# Patient Record
Sex: Female | Born: 1980 | Race: Black or African American | Hispanic: No | Marital: Single | Smoking: Former smoker
Health system: Southern US, Community
[De-identification: ages and names within clinical notes are randomized; demographics above are authoritative.]

## PROBLEM LIST (undated history)

## (undated) ENCOUNTER — Inpatient Hospital Stay (HOSPITAL_COMMUNITY): Payer: Self-pay

## (undated) DIAGNOSIS — E049 Nontoxic goiter, unspecified: Secondary | ICD-10-CM

## (undated) HISTORY — DX: Nontoxic goiter, unspecified: E04.9

---

## 2000-04-26 HISTORY — PX: EYE SURGERY: SHX253

## 2001-04-26 HISTORY — PX: PELVIC LAPAROSCOPY: SHX162

## 2001-07-26 ENCOUNTER — Encounter: Payer: Self-pay | Admitting: Obstetrics & Gynecology

## 2001-07-26 ENCOUNTER — Ambulatory Visit (HOSPITAL_COMMUNITY): Admission: RE | Admit: 2001-07-26 | Discharge: 2001-07-26 | Payer: Self-pay | Admitting: Obstetrics & Gynecology

## 2002-06-01 ENCOUNTER — Ambulatory Visit (HOSPITAL_COMMUNITY): Admission: RE | Admit: 2002-06-01 | Discharge: 2002-06-01 | Payer: Self-pay | Admitting: Obstetrics & Gynecology

## 2009-02-21 ENCOUNTER — Emergency Department (HOSPITAL_COMMUNITY): Admission: EM | Admit: 2009-02-21 | Discharge: 2009-02-21 | Payer: Self-pay | Admitting: Family Medicine

## 2009-02-24 ENCOUNTER — Ambulatory Visit (HOSPITAL_COMMUNITY): Admission: RE | Admit: 2009-02-24 | Discharge: 2009-02-24 | Payer: Self-pay | Admitting: Emergency Medicine

## 2009-03-11 ENCOUNTER — Emergency Department (HOSPITAL_COMMUNITY): Admission: EM | Admit: 2009-03-11 | Discharge: 2009-03-11 | Payer: Self-pay | Admitting: Family Medicine

## 2009-03-18 ENCOUNTER — Other Ambulatory Visit: Admission: RE | Admit: 2009-03-18 | Discharge: 2009-03-18 | Payer: Self-pay | Admitting: Interventional Radiology

## 2009-03-18 ENCOUNTER — Encounter: Admission: RE | Admit: 2009-03-18 | Discharge: 2009-03-18 | Payer: Self-pay | Admitting: Emergency Medicine

## 2010-05-17 ENCOUNTER — Encounter: Payer: Self-pay | Admitting: Emergency Medicine

## 2010-07-29 LAB — TSH: TSH: 0.021 u[IU]/mL — ABNORMAL LOW (ref 0.350–4.500)

## 2010-07-29 LAB — T4, FREE: Free T4: 1.16 ng/dL (ref 0.80–1.80)

## 2010-07-30 LAB — POCT I-STAT, CHEM 8
Chloride: 106 mEq/L (ref 96–112)
HCT: 42 % (ref 36.0–46.0)
Potassium: 4 mEq/L (ref 3.5–5.1)

## 2010-07-30 LAB — SEDIMENTATION RATE: Sed Rate: 9 mm/hr (ref 0–22)

## 2010-07-30 LAB — TSH: TSH: 0.028 u[IU]/mL — ABNORMAL LOW (ref 0.350–4.500)

## 2010-09-11 NOTE — Op Note (Signed)
NAME:  Tonya Porter, Tonya Porter NO.:  [id number]   MEDICAL RECORD NO.:  [id number]                   PATIENT TYPE:  AMB   LOCATION:  SDC                                  FACILITY:  [facility]   PHYSICIAN:  Genia Del, M.D.             DATE OF BIRTH:  02-25-1981   DATE OF PROCEDURE:  06/01/2002  DATE OF DISCHARGE:                                 OPERATIVE REPORT   PREOPERATIVE DIAGNOSIS:  Primary infertility and history of right  salpingectomy for ectopic pregnancy.   POSTOPERATIVE DIAGNOSES:  1. Primary infertility and history of right salpingectomy for ectopic     pregnancy.  2. Right tube surgically absent.  3. Left hydrosalpinx with obstruction.  4. Pelvic adhesions.   PROCEDURES:  1. Diagnostic laparoscopy with methylene blue hydrotubation.  2. Left distal tuboplasty.  3. Lysis of adhesions.   SURGEON:  Genia Del, M.D.   ANESTHESIOLOGIST:  Burnett Corrente, M.D.   DESCRIPTION OF PROCEDURE:  Under general anesthesia with endotracheal  intubation, the patient is in lithotomy position for operative laparoscopy.  She is prepped on the abdominal, suprapubic, vulvar, and vaginal areas, the  bladder is catheterized, and the patient is draped as usual.  The vaginal  exam under general anesthesia revealed a retroverted uterus, normal volume,  mobile, no adnexal mass.  We insert the speculum in the vagina.  The  anterior lip of the cervix is grasped with a one-toothed tenaculum, and the  uterus is cannulated with the Hulka cannula.  We then go abdominally.  An  infraumbilical incision is made with a scalpel over 10 mm after injecting  Marcaine 0.25% plain.  We insert the Veress needle while raising the  abdominal wall.  Security tests are done.  We then create a pneumoperitoneum  with 3 L of CO2.  Once achieved, the Veress needle is removed.  We insert  the trocar at that level with the laparoscope.  A brief inspection of the  pelvis cavity reveals  no adhesion with the anterior wall.  We therefore make  a 5 mm incision of the suprapubic level after injecting Marcaine 0.25%, and  we insert a 5 mm trocar at that level with a probe.  Inspection of the  pelvic cavity reveals a normal uterus in volume in appearance.  An adhesion  is present with the bladder on the right anterior uterus.  The right adnexa  shows no right tube.  It was removed because of an ectopic pregnancy  previously.  The right ovary is normal in volume and appearance.  A small  less than 2 cm solitary cyst is present.  On the left side the tube presents  a hydrosalpinx with obstruction, no fimbriae are present.  The tube is  adherent to the ovary, which is itself adherent to the wall, and the tube is  also adherent to the pelvic wall.  The left ovary is  normal in volume and  appearance otherwise.  Some fine adhesions are present in the posterior cul-  de-sac.  The appendix is normal and the rest of the abdominal cavity is  unremarkable.  Pictures are taken of the appendix and of the pelvic organs.  We then make a third incision on the left iliac area.  We transilluminate to  avoid the epigastric vessel.  We make a 5 mm incision with a scalpel after  injecting Marcaine 0.25%, and we enter a 5 mm trocar.  We proceed with  adhesiolysis in the posterior cul-de-sac.  We then go to the left tube.  Adhesions are removed with the rotatory scissors as much as possible.  It  would have been unsafe to attempt dissection of the very distal part of the  tube, since that area is in very close contact with the pelvic wall in  proximity with the vessels and the ureter.  We therefore proceed with a  distal tuboplasty, opening the tube as distally as possible using the  rotatory scissors combined with the bipolar for hemostasis.  After multiple  attempts verifying with methylene blue each time, we succeed in opening the  left tube.  Methylene blue is passing freely.  With tuboplasty  completed by  creating a wider opening with the rotatory scissors, the mucosa of the tube  is well-seen.  The tubal distal edge is free and is in close contact to the  left ovary, although the total length of the tube is about 5-6 cm.  Hemostasis is adequate.  The pelvic cavity is irrigated and suctioned.  Pictures are taken after tuboplasty is completed.  The instruments are  removed, CO2 is evacuated.  The incisions at the skin are closed with 4-0  Vicryl.  We also use them on top.  Hemostasis is adequate at all levels.  The estimated blood loss was 150 mL.  No complications occurred, and the  patient was brought to recovery room in good status.                                                Genia Del, M.D.    ML/MEDQ  D:  06/01/2002  T:  06/02/2002  Job:  [id number]

## 2010-09-25 ENCOUNTER — Inpatient Hospital Stay (INDEPENDENT_AMBULATORY_CARE_PROVIDER_SITE_OTHER)
Admission: RE | Admit: 2010-09-25 | Discharge: 2010-09-25 | Disposition: A | Discharge status: Home / Self Care | Payer: Self-pay | Source: Ambulatory Visit | Attending: Emergency Medicine | Admitting: Emergency Medicine

## 2010-09-25 ENCOUNTER — Ambulatory Visit (INDEPENDENT_AMBULATORY_CARE_PROVIDER_SITE_OTHER): Payer: Self-pay

## 2010-09-25 DIAGNOSIS — S20219A Contusion of unspecified front wall of thorax, initial encounter: Secondary | ICD-10-CM

## 2010-09-25 DIAGNOSIS — S139XXA Sprain of joints and ligaments of unspecified parts of neck, initial encounter: Secondary | ICD-10-CM

## 2014-12-23 ENCOUNTER — Ambulatory Visit: Payer: Self-pay | Admitting: Internal Medicine

## 2014-12-23 DIAGNOSIS — Z0289 Encounter for other administrative examinations: Secondary | ICD-10-CM

## 2016-03-23 ENCOUNTER — Encounter (INDEPENDENT_AMBULATORY_CARE_PROVIDER_SITE_OTHER): Payer: Self-pay | Admitting: Podiatry

## 2016-03-23 NOTE — Progress Notes (Signed)
This encounter was created in error - please disregard.

## 2016-05-20 ENCOUNTER — Other Ambulatory Visit: Payer: Self-pay

## 2016-05-20 ENCOUNTER — Other Ambulatory Visit (HOSPITAL_COMMUNITY)
Admission: RE | Admit: 2016-05-20 | Discharge: 2016-05-20 | Disposition: A | Discharge status: Home / Self Care | Payer: BLUE CROSS/BLUE SHIELD | Source: Ambulatory Visit | Attending: Family | Admitting: Family

## 2016-05-20 DIAGNOSIS — Z113 Encounter for screening for infections with a predominantly sexual mode of transmission: Secondary | ICD-10-CM | POA: Diagnosis present

## 2016-05-20 DIAGNOSIS — Z01419 Encounter for gynecological examination (general) (routine) without abnormal findings: Secondary | ICD-10-CM | POA: Diagnosis present

## 2016-05-20 LAB — TSH: TSH: 0 u[IU]/mL — AB (ref 0.41–5.90)

## 2016-05-21 ENCOUNTER — Other Ambulatory Visit: Payer: Self-pay | Admitting: Family

## 2016-05-21 DIAGNOSIS — E049 Nontoxic goiter, unspecified: Secondary | ICD-10-CM

## 2016-05-25 LAB — CYTOLOGY - PAP
CHLAMYDIA, DNA PROBE: NEGATIVE
DIAGNOSIS: NEGATIVE
NEISSERIA GONORRHEA: NEGATIVE
TRICH (WINDOWPATH): NEGATIVE

## 2016-06-01 ENCOUNTER — Ambulatory Visit
Admission: RE | Admit: 2016-06-01 | Discharge: 2016-06-01 | Disposition: A | Discharge status: Home / Self Care | Payer: BLUE CROSS/BLUE SHIELD | Source: Ambulatory Visit | Attending: Family | Admitting: Family

## 2016-06-01 DIAGNOSIS — E049 Nontoxic goiter, unspecified: Secondary | ICD-10-CM

## 2016-06-08 ENCOUNTER — Other Ambulatory Visit: Payer: Self-pay | Admitting: Family

## 2016-06-08 DIAGNOSIS — E041 Nontoxic single thyroid nodule: Secondary | ICD-10-CM

## 2016-06-15 ENCOUNTER — Other Ambulatory Visit (HOSPITAL_COMMUNITY)
Admission: RE | Admit: 2016-06-15 | Discharge: 2016-06-15 | Disposition: A | Discharge status: Home / Self Care | Payer: BLUE CROSS/BLUE SHIELD | Source: Ambulatory Visit | Attending: Interventional Radiology | Admitting: Interventional Radiology

## 2016-06-15 ENCOUNTER — Ambulatory Visit
Admission: RE | Admit: 2016-06-15 | Discharge: 2016-06-15 | Disposition: A | Discharge status: Home / Self Care | Payer: BLUE CROSS/BLUE SHIELD | Source: Ambulatory Visit | Attending: Family | Admitting: Family

## 2016-06-15 DIAGNOSIS — E041 Nontoxic single thyroid nodule: Secondary | ICD-10-CM

## 2016-06-28 ENCOUNTER — Ambulatory Visit (INDEPENDENT_AMBULATORY_CARE_PROVIDER_SITE_OTHER): Payer: BLUE CROSS/BLUE SHIELD | Admitting: Endocrinology

## 2016-06-28 ENCOUNTER — Encounter: Payer: Self-pay | Admitting: Endocrinology

## 2016-06-28 VITALS — BP 114/76 | HR 94 | Ht 63.0 in | Wt 142.0 lb

## 2016-06-28 DIAGNOSIS — E0520 Thyrotoxicosis with toxic multinodular goiter without thyrotoxic crisis or storm: Secondary | ICD-10-CM

## 2016-06-28 DIAGNOSIS — E042 Nontoxic multinodular goiter: Secondary | ICD-10-CM | POA: Insufficient documentation

## 2016-06-28 NOTE — Progress Notes (Signed)
Patient ID: Tonya Porter, female   DOB: 11/18/1980, 36 y.o.   MRN: [id number]                                                                                                                Reason for Appointment:  Hyperthyroidism, new consultation  Referring physician: Boneta LucksJennifer Brown   Chief complaint: Evaluation of thyroid   History of Present Illness:   For the last  patient 2-3 years she has had increased warmth and sweating; she is sometimes sweating excessively at night and wetting her clothes Occasionally also she will have a feeling of the heart palpitating. She tends to get shaky sometimes when she is doing some activities For the last year or so she has been waking up feeling tired and has increased fatigability also She has not lost any weight  She had been seen by her PCP in 1/18 for a general physical examination was also complaining of the right side of her neck swelling more than before She is apparently had thyroid nodules since about 2010 She was evaluated with ultrasound and needle aspiration biopsies at that time which were benign However she has not followed up for this problem subsequently She does not complain of any difficulty swallowing but occasionally may feel a little pressure in her neck, no difficulty breathing  Report of ultrasound: See end of the note  Needle aspiration biopsy done of the nodule in the Right Mid lobe near isthmus showed benign follicular nodule  Prior biopsy reports from 2010: FOLLICULAR LESION. SEE COMMENT. Comment: The differential diagnosis includes a partially cystic adenomatous nodule and a hyperplastic lesion. (Jdp:Mj)   Wt Readings from Last 3 Encounters:  06/28/16 142 lb (64.4 kg)      Thyroid function tests as follows:    Free T3 on 05/20/16 was 4.05, normal <3.90 Total T4 8.0     No results found for: THYROTRECAB   Allergies as of 06/28/2016   Not on File     Medication List       Accurate as of 06/28/16   2:59 PM. Always use your most recent med list.          ALLERGY 10 MG tablet Generic drug:  loratadine 1 tablet           No past medical history on file.  No past surgical history on file.  Family History  Problem Relation Age of Onset  . Thyroid disease Neg Hx     Social History:  reports that she has quit smoking. She has never used smokeless tobacco. Her alcohol and drug histories are not on file.  Allergies: Not on File  Review of Systems:  Review of Systems  Constitutional: Negative for weight loss and reduced appetite.  HENT: Negative for trouble swallowing.   Eyes: Negative for visual disturbance.  Respiratory:       No shortness of breath but may get tired more when trying to exercise  Cardiovascular: Positive for palpitations.  Gastrointestinal:  Occasionally will have increased frequency of bowel movements especially premenstrually. Otherwise may at times have constipation  Endocrine: Positive for heat intolerance. Negative for menstrual changes.  Genitourinary: Negative for frequency.  Musculoskeletal: Negative for joint pain.  Skin: Negative for rash.  Neurological: Positive for tremors. Negative for weakness.  Psychiatric/Behavioral: Negative for insomnia.      Examination:   BP 114/76   Pulse 94   Ht 5\' 3"  (1.6 m)   Wt 142 lb (64.4 kg)   SpO2 97%   BMI 25.15 kg/m    General Appearance:  well-built and nourished, pleasant, not anxious or hyperkinetic.        Eyes: No unusual prominence, lid lag or stare. No swelling of the eyelids   Neck: The thyroid is enlarged in the right lobe and isthmus Her thyroid is about 3-3.5 times normal on the right and firm and has at least 3 distinct nodules including 1 in the isthmus Unable to locate trachea in the suprasternal notch No stridor present  There is no lymphadenopathy .          Heart: normal S1 and S2, no murmurs .          Lungs: breath sounds are clear bilaterally Abdomen: no  hepatosplenomegaly or other palpable abnormality  Extremities: hands are warm but not diaphoretic. No ankle edema. 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  She has apparently mostly a T3 toxicosis but is clinically symptomatic with heat intolerance, fatigue and some palpitations, not clear how long she has been symptomatic   Discussed with the patient the causation of her hyperthyroidism and relationship to her nodular goiter Explained the plans for treatment which would likely be radioactive iodine given that this should be effective if she has a high enough uptake This needs to be confirmed and also will need to determine the activity of her thyroid nodules with the nuclear scan Described her the process for doing this uptake and scan Also discussed with radioactive iodine treatment and given her a handout on this  Referral made for nuclear medicine  Patient understands the above discussion and treatment options. All questions were answered satisfactorily  MULTINODULAR goiter: Although her biopsy in 2010 was indeterminate there apparently is no change in the size of the nodules that were biopsied on recent ultrasound and no further evaluation is needed now Then new biopsy showed benign lesion  Consult note sent to referring physician  [facility] 06/28/2016, 2:59 PM    Addendum: Report of ultrasound of thyroid on 06/01/16:  Nodule # 2: Location: Right; Mid near isthmus  Maximum size: 1.8 cm; Other 2 dimensions: 0.8 x 1.5 cm  Composition: solid/almost completely solid (2) Echogenicity: hypoechoic (2) Shape: not taller-than-wide (0) Margins: smooth (0) Echogenic foci: none (0)  ACR TI-RADS total points: 4.  Impression:  **Given size (>/= 1.5 cm) and appearance, fine needle aspiration of this moderately suspicious nodule should be  considered based on TI-RADS criteria.  2.8 x 2.1 x 2.6 cm complex nodule with coarse calcifications, mid right; this was previously biopsied.  2.3 x 1.7 x 2.3 cm complex mostly solid nodule, inferior pole right; this was previously biopsied

## 2016-06-28 NOTE — Patient Instructions (Signed)
COMMON QUESTIONS ABOUT RADIOACTIVE IODINE TREATMENT   Why is radioactive iodine used?  Radioactive iodine is a very common option for treating an overactive thyroid. Normally the thyroid gland uses iodine to make thyroid hormone. An overactive thyroid gland extracts the iodine more completely from the bloodstream. When radioactive iodine is given orally most of it is retained within the overactive thyroid.  The concentrated radioactivity slowly destroys the thyroid cells and controls the over activity.  Because the radioactive rays travel only a very short distance other organs are not affected.  Thus the radioactive iodine works in a targeted manner effectively and safely.   How is radioactive iodine given?  It is usually given as a capsule in a single dose.  First, a very small test dose of radioactive iodine is given and the amount retained in the thyroid is measured the next day with a special counter.  This helps us calculate the dose of radioactive iodine to be given for treatment.  The radioactive iodine is given under the supervision of a Radiologist with the proper precautions.  Since the effective dose of the radioactive iodine is approximate, rarely one may need a second dose to control the over activity.Thus the treatment is extremely simple to take.  What happens after the radioactive iodine is given?  There is a gradual reduction in the over activity of the thyroid.  Initially it takes time to get rid of the excess thyroid hormone already present in the body.  With the slow destruction of the thyroid cells the high levels start coming down after 2 to 3 weeks.  It may take up to 8 weeks to completely control the thyroid.  Usually most of the thyroid cells are destroyed and thyroid levels start getting low by two months.  However this will vary from patient to patient and the thyroid levels may remain normal for some time.  It is very important to have regular follow-up after the treatment.   Once the thyroid levels get low you will be started on a thyroid supplement, taken once daily for life.    Are there any side effects of radioactive iodine?  Generally no side effects are encountered.  Rarely one may have discomfort and swelling in the thyroid gland for a few days.  This should be reported if there is significant pain.  The radiation exposure to internal organs from radioactive iodine is no more than in a kidney x-ray or barium studies.  There are no effects on reproductive organs but women who are pregnant or nursing should not take radioactive iodine.  No long-term effects including cancer have been seen.  Women can have children after treatment with radioactive iodine although it is recommended to avoid pregnancy for about six months.  

## 2016-07-15 ENCOUNTER — Encounter (HOSPITAL_COMMUNITY): Payer: Self-pay | Admitting: Radiology

## 2016-07-15 ENCOUNTER — Encounter (HOSPITAL_COMMUNITY)
Admission: RE | Admit: 2016-07-15 | Discharge: 2016-07-15 | Disposition: A | Discharge status: Home / Self Care | Payer: BLUE CROSS/BLUE SHIELD | Source: Ambulatory Visit | Attending: Endocrinology | Admitting: Endocrinology

## 2016-07-15 DIAGNOSIS — E0520 Thyrotoxicosis with toxic multinodular goiter without thyrotoxic crisis or storm: Secondary | ICD-10-CM | POA: Insufficient documentation

## 2016-07-15 MED ORDER — SODIUM IODIDE I 131 CAPSULE: 11.5000 | Freq: Once | INTRAVENOUS | Status: DC | PRN

## 2016-07-16 ENCOUNTER — Encounter (HOSPITAL_COMMUNITY)
Admission: RE | Admit: 2016-07-16 | Discharge: 2016-07-16 | Disposition: A | Discharge status: Home / Self Care | Payer: Self-pay | Source: Ambulatory Visit | Attending: Endocrinology | Admitting: Endocrinology

## 2016-07-26 ENCOUNTER — Telehealth: Payer: Self-pay | Admitting: Endocrinology

## 2016-07-26 ENCOUNTER — Other Ambulatory Visit: Payer: Self-pay | Admitting: Endocrinology

## 2016-07-26 DIAGNOSIS — E0520 Thyrotoxicosis with toxic multinodular goiter without thyrotoxic crisis or storm: Secondary | ICD-10-CM

## 2016-07-26 NOTE — Telephone Encounter (Signed)
LM for pt to call back regarding note

## 2016-07-26 NOTE — Telephone Encounter (Signed)
Pt aware of note and visit moved to end of month

## 2016-07-26 NOTE — Telephone Encounter (Signed)
Please let her know that her thyroid has overactive areas and we can treat the overactive thyroid with radioactive iodine.  We will send a referral for this to be scheduled, if any questions she should let us know.   Would like to delay her follow-up appointment with labs until the end of the month, currently scheduled for 4/16

## 2016-08-06 ENCOUNTER — Other Ambulatory Visit: Payer: BLUE CROSS/BLUE SHIELD

## 2016-08-09 ENCOUNTER — Ambulatory Visit: Payer: BLUE CROSS/BLUE SHIELD | Admitting: Endocrinology

## 2016-08-12 ENCOUNTER — Encounter (HOSPITAL_COMMUNITY)
Admission: RE | Admit: 2016-08-12 | Discharge: 2016-08-12 | Disposition: A | Discharge status: Home / Self Care | Payer: BLUE CROSS/BLUE SHIELD | Source: Ambulatory Visit | Attending: Endocrinology | Admitting: Endocrinology

## 2016-08-12 DIAGNOSIS — E0520 Thyrotoxicosis with toxic multinodular goiter without thyrotoxic crisis or storm: Secondary | ICD-10-CM

## 2016-08-12 LAB — HCG, SERUM, QUALITATIVE: Preg, Serum: NEGATIVE

## 2016-08-12 MED ORDER — SODIUM IODIDE I 131 CAPSULE: 29.3000 | Freq: Once | INTRAVENOUS | Status: DC | PRN

## 2016-08-19 ENCOUNTER — Other Ambulatory Visit: Payer: BLUE CROSS/BLUE SHIELD

## 2016-08-23 ENCOUNTER — Ambulatory Visit: Payer: BLUE CROSS/BLUE SHIELD | Admitting: Endocrinology

## 2016-09-17 ENCOUNTER — Other Ambulatory Visit (INDEPENDENT_AMBULATORY_CARE_PROVIDER_SITE_OTHER): Payer: Self-pay

## 2016-09-17 DIAGNOSIS — E0520 Thyrotoxicosis with toxic multinodular goiter without thyrotoxic crisis or storm: Secondary | ICD-10-CM

## 2016-09-17 LAB — TSH: TSH: 0.01 u[IU]/mL — AB (ref 0.35–4.50)

## 2016-09-17 LAB — T3, FREE: T3, Free: 4 pg/mL (ref 2.3–4.2)

## 2016-09-17 LAB — T4, FREE: Free T4: 1.05 ng/dL (ref 0.60–1.60)

## 2016-09-22 ENCOUNTER — Encounter: Payer: Self-pay | Admitting: Endocrinology

## 2016-09-22 ENCOUNTER — Ambulatory Visit (INDEPENDENT_AMBULATORY_CARE_PROVIDER_SITE_OTHER): Payer: BLUE CROSS/BLUE SHIELD | Admitting: Endocrinology

## 2016-09-22 VITALS — BP 118/76 | HR 89 | Ht 63.0 in | Wt 140.4 lb

## 2016-09-22 DIAGNOSIS — E0520 Thyrotoxicosis with toxic multinodular goiter without thyrotoxic crisis or storm: Secondary | ICD-10-CM

## 2016-09-22 NOTE — Progress Notes (Signed)
Patient ID: Tonya Porter, female   DOB: 06/05/1980, 36 y.o.   MRN: [id number]                                                                                                                Reason for Appointment:  Hyperthyroidism, follow-up  Referring physician: Boneta LucksJennifer Brown   Chief complaint: Sweating   History of Present Illness:   Background information on initial consultation:  For the last  patient 2-3 years she has had increased warmth and sweating; she is sometimes sweating excessively at night and wetting her clothes Occasionally also she will have a feeling of the heart palpitating. She tends to get shaky sometimes when she is doing some activities For the last year or so she has been waking up feeling tired and has increased fatigability also  She had been seen by her PCP in 1/18 for a general physical examination was also complaining of the right side of her neck swelling more than before She is apparently had thyroid nodules since about 2010 She was evaluated with ultrasound and needle aspiration biopsies at that time which were benign However she has not followed up for this problem subsequently She does not complain of any difficulty swallowing but occasionally may feel a little pressure in her neck, no difficulty breathing  Report of ultrasound: See end of the note Needle aspiration biopsy done of the nodule in the Right Mid lobe near isthmus showed benign follicular nodule Prior biopsy reports from 2010: FOLLICULAR LESION. SEE COMMENT. Comment: The differential diagnosis includes a partially cystic adenomatousnodule and a hyperplastic lesion. 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 she was evaluated with a thyroid scan which showed hot nodules on the right side She was treated with 29 mCi of I-131 on 08/12/16  Subsequently she has had less sweating although not completely gone, also has less palpitations, more normal bowel  movements, less difficulty swallowing, less shakiness Also she thinks that the swelling in her neck is better  Wt Readings from Last 3 Encounters:  09/22/16 140 lb 6.4 oz (63.7 kg)  06/28/16 142 lb (64.4 kg)     Thyroid function tests as follows:    Free T3 on 05/20/16 was 4.05, normal <3.90 Total T4 8.0    Lab Results  Component Value Date   T3FREE 4.0 09/17/2016   FREET4 1.05 09/17/2016   FREET4 1.16 03/11/2009    Lab Results  Component Value Date   TSH 0.01 (L) 09/17/2016     No results found for: THYROTRECAB   Allergies as of 09/22/2016      Reactions   Latex Hives      Medication List       Accurate as of 09/22/16  9:49 AM. Always use your most recent med list.          ALLERGY 10 MG tablet Generic drug:  loratadine 1 tablet           No past medical history on file.  No  past surgical history on file.  Family History  Problem Relation Age of Onset  . Thyroid disease Neg Hx     Social History:  reports that she has quit smoking. She has never used smokeless tobacco. Her alcohol and drug histories are not on file.  Allergies:  Allergies  Allergen Reactions  . Latex Hives    Review of Systems:  Review of Systems      Examination:   BP 118/76   Pulse 89   Ht 5\' 3"  (1.6 m)   Wt 140 lb 6.4 oz (63.7 kg)   SpO2 97%   BMI 24.87 kg/m   Repeat pulse was 80 Her thyroid exam shows a prominent 2 cm, firm lesion in the midline as also a 2 cm firm right upper pole nodule and a smaller nodule in between. Left side is not clearly palpable  Neurological: Deep tendon reflexes at biceps are slightly brisk No tremor is present    Assessment/Plan:   Hyperthyroidism, from toxic nodular goiter  She has a marked reduction in the size of her right thyroid lobe with I-131 treatment done about 5 weeks ago Symptomatically also she is better Her free T3 is still upper normal, previously minimally increased  She will continue to be followed with  serial thyroid functions and will come back in 6 weeks  MULTINODULAR goiter: Since the nodules on the right appear to be smaller done on the last exam will continue to follow these clinically    Tonya Porter 09/22/2016, 9:49 AM

## 2016-11-02 ENCOUNTER — Other Ambulatory Visit: Payer: Self-pay | Admitting: Endocrinology

## 2016-11-02 ENCOUNTER — Other Ambulatory Visit (INDEPENDENT_AMBULATORY_CARE_PROVIDER_SITE_OTHER): Payer: Self-pay

## 2016-11-02 DIAGNOSIS — E0520 Thyrotoxicosis with toxic multinodular goiter without thyrotoxic crisis or storm: Secondary | ICD-10-CM

## 2016-11-02 LAB — T3, FREE: T3 FREE: 2.8 pg/mL (ref 2.3–4.2)

## 2016-11-02 LAB — T4, FREE: FREE T4: 0.42 ng/dL — AB (ref 0.60–1.60)

## 2016-11-02 LAB — TSH: TSH: 3.03 u[IU]/mL (ref 0.35–4.50)

## 2016-11-05 ENCOUNTER — Encounter: Payer: Self-pay | Admitting: Endocrinology

## 2016-11-05 ENCOUNTER — Ambulatory Visit (INDEPENDENT_AMBULATORY_CARE_PROVIDER_SITE_OTHER): Payer: Self-pay | Admitting: Endocrinology

## 2016-11-05 VITALS — BP 122/72 | HR 79 | Ht 63.0 in | Wt 143.6 lb

## 2016-11-05 DIAGNOSIS — E890 Postprocedural hypothyroidism: Secondary | ICD-10-CM

## 2016-11-05 MED ORDER — LEVOTHYROXINE SODIUM 75 MCG PO TABS: 75.0000 ug | ORAL_TABLET | Freq: Every day | ORAL | 3 refills | Status: DC

## 2016-11-05 NOTE — Progress Notes (Signed)
Patient ID: Tonya BaltimoreLakisha Vaux, female   DOB: 02/19/1981, 36 y.o.   MRN: [id number]                                                                                                                Reason for Appointment:  follow-up of thyroid   Chief complaint: Sweating   History of Present Illness:   Background information on initial consultation:  For the last  patient 2-3 years she has had increased warmth and sweating; she is sometimes sweating excessively at night and wetting her clothes Occasionally also she will have a feeling of the heart palpitating. She tends to get shaky sometimes when she is doing some activities For the last year or so she has been waking up feeling tired and has increased fatigability also  She had been seen by her PCP in 1/18 for a general physical examination was also complaining of the right side of her neck swelling more than before She is apparently had thyroid nodules since about 2010 She was evaluated with ultrasound and needle aspiration biopsies at that time which were benign However she has not followed up for this problem subsequently She does not complain of any difficulty swallowing but occasionally may feel a little pressure in her neck, no difficulty breathing  Report of ultrasound: See end of the note Needle aspiration biopsy done of the nodule in the Right Mid lobe near isthmus showed benign follicular nodule Prior biopsy reports from 2010: FOLLICULAR LESION. SEE COMMENT. Comment: The differential diagnosis includes a partially cystic adenomatousnodule and a hyperplastic lesion. 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 she was evaluated with a thyroid scan which showed hot nodules on the right side She was treated with 29 mCi of I-131 on 08/12/16  Her thyroid levels were improved in 5/18  More recently she is feeling fairly good although she thinks her sweating is not completely resolved Does not  complain of fatigue Has gained 3 pounds No cold intolerance  Her free T4 level is now below normal with normal TSH  Wt Readings from Last 3 Encounters:  11/05/16 143 lb 9.6 oz (65.1 kg)  09/22/16 140 lb 6.4 oz (63.7 kg)  06/28/16 142 lb (64.4 kg)     Thyroid function tests as follows:    Free T3 on 05/20/16 was 4.05, normal <3.90 Total T4 8.0    Lab Results  Component Value Date   T3FREE 2.8 11/02/2016   T3FREE 4.0 09/17/2016   FREET4 0.42 (L) 11/02/2016   FREET4 1.05 09/17/2016   FREET4 1.16 03/11/2009    Lab Results  Component Value Date   TSH 3.03 11/02/2016     No results found for: THYROTRECAB   Allergies as of 11/05/2016      Reactions   Latex Hives      Medication List       Accurate as of 11/05/16  1:45 PM. Always use your most recent med list.  ALLERGY 10 MG tablet Generic drug:  loratadine 1 tablet           No past medical history on file.  No past surgical history on file.  Family History  Problem Relation Age of Onset  . Thyroid disease Neg Hx     Social History:  reports that she has quit smoking. She has never used smokeless tobacco. Her alcohol and drug histories are not on file.  Allergies:  Allergies  Allergen Reactions  . Latex Hives     Review of Systems    Examination:   BP 122/72   Pulse 79   Ht 5\' 3"  (1.6 m)   Wt 143 lb 9.6 oz (65.1 kg)   SpO2 98%   BMI 25.44 kg/m   She looks well No puffiness of her face or hands Her thyroid exam shows a prominent 1.5 cm, firm lesion in the midline as also a 2 cm firm right upper pole nodule and a smaller nodule in between. 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 although minimally symptomatic but has a significantly low free T4 Has had further reduction in her thyroid nodules which were  fairly significant before that I-131 treatment  Discussed with the patient the current need for thyroid supplementation and explained to her that this would be identical to natural thyroid hormone Currently not able to decide whether this is a temporary decrease in thyroid function or this is more permanent since she may have recovery in the left lobe of thyroid function Discussed when to take the thyroid hormone supplement She will follow-up in 6 weeks but let us know if she is experiencing either unusual fatigue or starting to cut more sweating and palpitations again  Continue with sweating episodes: Unlikely to at these are related to thyroid disease  Total visit time = 15 minutes  Bridey Brookover 11/05/2016, 1:45 PM

## 2016-12-14 ENCOUNTER — Other Ambulatory Visit (INDEPENDENT_AMBULATORY_CARE_PROVIDER_SITE_OTHER): Payer: Self-pay

## 2016-12-14 DIAGNOSIS — E890 Postprocedural hypothyroidism: Secondary | ICD-10-CM

## 2016-12-14 LAB — TSH: TSH: 0.66 u[IU]/mL (ref 0.35–4.50)

## 2016-12-14 LAB — T4, FREE: Free T4: 0.84 ng/dL (ref 0.60–1.60)

## 2016-12-17 ENCOUNTER — Encounter: Payer: Self-pay | Admitting: Endocrinology

## 2016-12-17 ENCOUNTER — Ambulatory Visit (INDEPENDENT_AMBULATORY_CARE_PROVIDER_SITE_OTHER): Payer: Self-pay | Admitting: Endocrinology

## 2016-12-17 VITALS — BP 122/76 | HR 84 | Ht 63.0 in | Wt 140.0 lb

## 2016-12-17 DIAGNOSIS — E890 Postprocedural hypothyroidism: Secondary | ICD-10-CM

## 2016-12-17 NOTE — Progress Notes (Signed)
Patient ID: Tonya Porter, female   DOB: 1980/09/01, 36 y.o.   MRN: [id number]                                                                                                                Reason for Appointment:  follow-up of thyroid   Chief complaint: Sweating   History of Present Illness:   Background information on initial consultation:  For the last  patient 2-3 years she has had increased warmth and sweating; she is sometimes sweating excessively at night and wetting her clothes Occasionally also she will have a feeling of the heart palpitating. She tends to get shaky sometimes when she is doing some activities For the last year or so she has been waking up feeling tired and has increased fatigability also  She had been seen by her PCP in 1/18 for a general physical examination was also complaining of the right side of her neck swelling more than before She is apparently had thyroid nodules since about 2010 She was evaluated with ultrasound and needle aspiration biopsies at that time which were benign However she has not followed up for this problem subsequently She does not complain of any difficulty swallowing but occasionally may feel a little pressure in her neck, no difficulty breathing  Report of ultrasound: See end of the note Needle aspiration biopsy done of the nodule in the Right Mid lobe near isthmus showed benign follicular nodule Prior biopsy reports from 2010: FOLLICULAR LESION. SEE COMMENT. Comment: The differential diagnosis includes a partially cystic adenomatousnodule and a hyperplastic lesion. 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 Has no heat or cold intolerance  Her TSH is now quite normal and free T4 is back to normal also  Wt Readings from Last 3 Encounters:  12/17/16 140 lb (63.5 kg)  11/05/16 143 lb 9.6 oz (65.1 kg)  09/22/16 140 lb 6.4 oz (63.7 kg)     Thyroid function tests as follows:    Baseline Free T3 on 05/20/16 was 4.05, normal <3.90   Lab Results  Component Value Date   T3FREE 2.8 11/02/2016   T3FREE 4.0 09/17/2016   FREET4 0.84 12/14/2016   FREET4 0.42 (L) 11/02/2016   FREET4 1.05 09/17/2016    Lab Results  Component Value Date   TSH 0.66 12/14/2016     No results found for: THYROTRECAB   Allergies as of 12/17/2016      Reactions   Latex Hives      Medication List  Accurate as of 12/17/16  1:21 PM. Always use your most recent med list.          ALLERGY 10 MG tablet Generic drug:  loratadine 1 tablet   levothyroxine 75 MCG tablet Commonly known as:  SYNTHROID, LEVOTHROID Take 1 tablet (75 mcg total) by mouth daily.           No past medical history on file.  No past surgical history on file.  Family History  Problem Relation Age of Onset  . Thyroid disease Neg Hx     Social History:  reports that she has quit smoking. She has never used smokeless tobacco. Her alcohol and drug histories are not on file.  Allergies:  Allergies  Allergen Reactions  . Latex Hives     Review of Systems  Has minor problems with allergic rhinitis    Examination:   BP 122/76   Pulse 84   Ht 5\' 3"  (1.6 m)   Wt 140 lb (63.5 kg)   SpO2 99%   BMI 24.80 kg/m   No puffiness of her Eyes Her thyroid exam shows a 1.5 cm, firm lesion in the midline as also a 1.5-2 cm firm right upper pole nodule and a smaller nodule in between. 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  Her how it is smaller in size overall since her treatment Although she did not have symptoms of hypothyroidism she had recently low free T4 level when started on levothyroxine supplementation with 75 g  She is doing quite well now subjectively She does have some occasional sweating but this is not as prominent She does have some residual nodularity on her thyroid on exam  Currently her thyroid levels of back to normal and she looks euthyroid on exam Discussed that she may need long-term supplementation with levothyroxine and current regimen is adequate She may initially need more close follow-up and then at longer intervals QUESTIONS were answered satisfactorily  Follow-up to be scheduled in 3 months unless she called with any new symptoms  Total visit time = 15 minutes  Tonya Porter 12/17/2016, 1:21 PM

## 2017-03-11 ENCOUNTER — Other Ambulatory Visit: Payer: Self-pay | Admitting: Endocrinology

## 2017-03-21 ENCOUNTER — Ambulatory Visit: Payer: Self-pay | Admitting: Endocrinology

## 2017-03-21 ENCOUNTER — Other Ambulatory Visit (INDEPENDENT_AMBULATORY_CARE_PROVIDER_SITE_OTHER): Payer: Self-pay

## 2017-03-21 DIAGNOSIS — E890 Postprocedural hypothyroidism: Secondary | ICD-10-CM

## 2017-03-21 LAB — T4, FREE: FREE T4: 0.84 ng/dL (ref 0.60–1.60)

## 2017-03-21 LAB — TSH: TSH: 0.72 u[IU]/mL (ref 0.35–4.50)

## 2017-03-23 ENCOUNTER — Ambulatory Visit (INDEPENDENT_AMBULATORY_CARE_PROVIDER_SITE_OTHER): Payer: Self-pay | Admitting: Endocrinology

## 2017-03-23 ENCOUNTER — Encounter: Payer: Self-pay | Admitting: Endocrinology

## 2017-03-23 VITALS — BP 132/78 | HR 86 | Ht 63.0 in | Wt 149.6 lb

## 2017-03-23 DIAGNOSIS — E890 Postprocedural hypothyroidism: Secondary | ICD-10-CM | POA: Insufficient documentation

## 2017-03-23 NOTE — Progress Notes (Signed)
Patient ID: Tonya BaltimoreLakisha Porter, female   DOB: 03/11/1981, 36 y.o.   MRN: [id number]                                                                                                                Reason for Appointment:  follow-up of thyroid   Chief complaint: Sweating   History of Present Illness:   Background information on initial consultation:  For the last  patient 2-3 years she has had increased warmth and sweating; she is sometimes sweating excessively at night and wetting her clothes Occasionally also she will have a feeling of the heart palpitating. She tends to get shaky sometimes when she is doing some activities For the last year or so she has been waking up feeling tired and has increased fatigability also  She had been seen by her PCP in 1/18 for a general physical examination was also complaining of the right side of her neck swelling more than before She is apparently had thyroid nodules since about 2010 She was evaluated with ultrasound and needle aspiration biopsies at that time which were benign However she has not followed up for this problem subsequently She does not complain of any difficulty swallowing but occasionally may feel a little pressure in her neck, no difficulty breathing  Report of ultrasound: See end of the note Needle aspiration biopsy done of the nodule in the Right Mid lobe near isthmus showed benign follicular nodule Prior biopsy reports from 2010: FOLLICULAR LESION. SEE COMMENT. Comment: The differential diagnosis includes a partially cystic adenomatousnodule and a hyperplastic lesion. 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 she was evaluated with a thyroid scan which showed hot nodules on the right side She was treated with 29 mCi of I-131 on 08/12/16  Her thyroid levels were indicating hypothyroidism in 7/18 She was however having only minimal symptoms of fatigue or cold intolerance, had only gained 3  pounds  She has been started on 75 g of levothyroxine since 7/18  Although her weight had come down initially with starting levothyroxine and has gone up significantly She does not complain of any unusual fatigue Has no heat or cold intolerance, just has some coldness of her feet No palpitations or shakiness  Her TSH is now quite consistently normal and free T4 is the same as before   Wt Readings from Last 3 Encounters:  03/23/17 149 lb 9.6 oz (67.9 kg)  12/17/16 140 lb (63.5 kg)  11/05/16 143 lb 9.6 oz (65.1 kg)     Thyroid function tests as follows:    Baseline Free T3 on 05/20/16 was 4.05, normal <3.90   Lab Results  Component Value Date   TSH 0.72 03/21/2017   TSH 0.66 12/14/2016   TSH 3.03 11/02/2016   FREET4 0.84 03/21/2017   FREET4 0.84 12/14/2016   FREET4 0.42 (L) 11/02/2016      No results found for: THYROTRECAB   Allergies as of 03/23/2017      Reactions   Latex Hives  Medication List        Accurate as of 03/23/17  3:29 PM. Always use your most recent med list.          ALLERGY 10 MG tablet Generic drug:  loratadine 1 tablet   levothyroxine 75 MCG tablet Commonly known as:  SYNTHROID, LEVOTHROID TAKE 1 TABLET BY MOUTH ONCE DAILY           History reviewed. No pertinent past medical history.  History reviewed. No pertinent surgical history.  Family History  Problem Relation Age of Onset  . Thyroid disease Neg Hx     Social History:  reports that she has quit smoking. she has never used smokeless tobacco. Her alcohol and drug histories are not on file.  Allergies:  Allergies  Allergen Reactions  . 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

## 2017-07-23 ENCOUNTER — Other Ambulatory Visit: Payer: Self-pay | Admitting: Endocrinology

## 2017-09-16 ENCOUNTER — Other Ambulatory Visit: Payer: Self-pay | Admitting: Endocrinology

## 2017-09-26 ENCOUNTER — Other Ambulatory Visit: Payer: Self-pay

## 2017-09-28 ENCOUNTER — Other Ambulatory Visit (INDEPENDENT_AMBULATORY_CARE_PROVIDER_SITE_OTHER): Payer: Self-pay

## 2017-09-28 DIAGNOSIS — E890 Postprocedural hypothyroidism: Secondary | ICD-10-CM

## 2017-09-28 LAB — T4, FREE: FREE T4: 1.1 ng/dL (ref 0.60–1.60)

## 2017-09-28 LAB — TSH: TSH: 0.55 u[IU]/mL (ref 0.35–4.50)

## 2017-09-29 ENCOUNTER — Ambulatory Visit: Payer: Self-pay | Admitting: Endocrinology

## 2017-10-04 ENCOUNTER — Encounter: Payer: Self-pay | Admitting: Endocrinology

## 2017-10-04 ENCOUNTER — Ambulatory Visit (INDEPENDENT_AMBULATORY_CARE_PROVIDER_SITE_OTHER): Payer: Self-pay | Admitting: Endocrinology

## 2017-10-04 VITALS — BP 118/68 | HR 91 | Wt 151.6 lb

## 2017-10-04 DIAGNOSIS — E890 Postprocedural hypothyroidism: Secondary | ICD-10-CM

## 2017-10-04 NOTE — Progress Notes (Signed)
Patient ID: Tonya Porter, female   DOB: 03/27/1981, 37 y.o.   MRN: [id number]                                                                                                                Reason for Appointment:  follow-up of thyroid    History of Present Illness:   Background information on initial consultation:  For the last  patient 2-3 years she has had increased warmth and sweating; she is sometimes sweating excessively at night and wetting her clothes Occasionally also she will have a feeling of the heart palpitating. She tends to get shaky sometimes when she is doing some activities For the last year or so she has been waking up feeling tired and has increased fatigability also  She had been seen by her PCP in 1/18 for a general physical examination was also complaining of the right side of her neck swelling more than before She is apparently had thyroid nodules since about 2010 She was evaluated with ultrasound and needle aspiration biopsies at that time which were benign However she has not followed up for this problem subsequently She does not complain of any difficulty swallowing but occasionally may feel a little pressure in her neck, no difficulty breathing  Report of ultrasound: See end of the note Needle aspiration biopsy done of the nodule in the Right Mid lobe near isthmus showed benign follicular nodule Prior biopsy reports from 2010: FOLLICULAR LESION. SEE COMMENT. Comment: The differential diagnosis includes a partially cystic adenomatousnodule and a hyperplastic lesion. 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 she was evaluated with a thyroid scan which showed hot nodules on the right side She was treated with 29 mCi of I-131 on 08/12/16  She developed mild hypothyroidism in 7/18 She was however having only minimal symptoms of fatigue or cold intolerance, had only gained 3 pounds  She has been on 75 g of levothyroxine  since 7/18 She is taking this consistently before breakfast  Currently feels fairly good with her energy level and her weight is recently about the same She is asking about having a longer menstrual cycle recently  Her TSH is now quite consistently normal although still below 1.0    Wt Readings from Last 3 Encounters:  10/04/17 151 lb 9.6 oz (68.8 kg)  03/23/17 149 lb 9.6 oz (67.9 kg)  12/17/16 140 lb (63.5 kg)     Thyroid function tests as follows:    Baseline Free T3 on 05/20/16 was 4.05, normal <3.90   Lab Results  Component Value Date   TSH 0.55 09/28/2017   TSH 0.72 03/21/2017   TSH 0.66 12/14/2016   FREET4 1.10 09/28/2017   FREET4 0.84 03/21/2017   FREET4 0.84 12/14/2016      No results found for: THYROTRECAB   Allergies as of 10/04/2017      Reactions   Latex Hives      Medication List        Accurate as of 10/04/17  4:29 PM. Always use your most recent med list.          ALLERGY 10 MG tablet Generic drug:  loratadine 1 tablet   levothyroxine 75 MCG tablet Commonly known as:  SYNTHROID, LEVOTHROID TAKE 1 TABLET EVERY DAY           Past Medical History:  Diagnosis Date  . Goiter     No past surgical history on file.  Family History  Problem Relation Age of Onset  . Thyroid disease Neg Hx     Social History:  reports that she has quit smoking. She has never used smokeless tobacco. Her alcohol and drug histories are not on file.  Allergies:  Allergies  Allergen Reactions  . 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

## 2017-10-10 ENCOUNTER — Emergency Department (HOSPITAL_COMMUNITY): Payer: Self-pay

## 2017-10-10 ENCOUNTER — Emergency Department (HOSPITAL_COMMUNITY)
Admission: EM | Admit: 2017-10-10 | Discharge: 2017-10-10 | Disposition: A | Discharge status: Home / Self Care | Payer: Self-pay | Attending: Emergency Medicine | Admitting: Emergency Medicine

## 2017-10-10 ENCOUNTER — Other Ambulatory Visit: Payer: Self-pay

## 2017-10-10 ENCOUNTER — Encounter (HOSPITAL_COMMUNITY): Payer: Self-pay

## 2017-10-10 DIAGNOSIS — N938 Other specified abnormal uterine and vaginal bleeding: Secondary | ICD-10-CM | POA: Insufficient documentation

## 2017-10-10 DIAGNOSIS — Z87891 Personal history of nicotine dependence: Secondary | ICD-10-CM | POA: Insufficient documentation

## 2017-10-10 DIAGNOSIS — E039 Hypothyroidism, unspecified: Secondary | ICD-10-CM | POA: Insufficient documentation

## 2017-10-10 DIAGNOSIS — N939 Abnormal uterine and vaginal bleeding, unspecified: Secondary | ICD-10-CM

## 2017-10-10 DIAGNOSIS — Z79899 Other long term (current) drug therapy: Secondary | ICD-10-CM | POA: Insufficient documentation

## 2017-10-10 DIAGNOSIS — O3680X0 Pregnancy with inconclusive fetal viability, not applicable or unspecified: Secondary | ICD-10-CM

## 2017-10-10 LAB — CBC WITH DIFFERENTIAL/PLATELET
Abs Immature Granulocytes: 0 K/uL (ref 0.0–0.1)
Basophils Absolute: 0 K/uL (ref 0.0–0.1)
Basophils Relative: 1 %
Eosinophils Absolute: 0.2 K/uL (ref 0.0–0.7)
Eosinophils Relative: 4 %
HCT: 32 % — ABNORMAL LOW (ref 36.0–46.0)
Hemoglobin: 10.3 g/dL — ABNORMAL LOW (ref 12.0–15.0)
Immature Granulocytes: 0 %
Lymphocytes Relative: 32 %
Lymphs Abs: 1.4 K/uL (ref 0.7–4.0)
MCH: 27.1 pg (ref 26.0–34.0)
MCHC: 32.2 g/dL (ref 30.0–36.0)
MCV: 84.2 fL (ref 78.0–100.0)
Monocytes Absolute: 0.4 K/uL (ref 0.1–1.0)
Monocytes Relative: 9 %
Neutro Abs: 2.4 K/uL (ref 1.7–7.7)
Neutrophils Relative %: 54 %
Platelets: 214 K/uL (ref 150–400)
RBC: 3.8 MIL/uL — ABNORMAL LOW (ref 3.87–5.11)
RDW: 14 % (ref 11.5–15.5)
WBC: 4.3 K/uL (ref 4.0–10.5)

## 2017-10-10 LAB — COMPREHENSIVE METABOLIC PANEL WITH GFR
ALT: 11 U/L — ABNORMAL LOW (ref 14–54)
AST: 16 U/L (ref 15–41)
Albumin: 3.5 g/dL (ref 3.5–5.0)
Alkaline Phosphatase: 45 U/L (ref 38–126)
Anion gap: 7 (ref 5–15)
BUN: <5 mg/dL — ABNORMAL LOW (ref 6–20)
CO2: 21 mmol/L — ABNORMAL LOW (ref 22–32)
Calcium: 8.7 mg/dL — ABNORMAL LOW (ref 8.9–10.3)
Chloride: 110 mmol/L (ref 101–111)
Creatinine, Ser: 0.73 mg/dL (ref 0.44–1.00)
GFR calc Af Amer: >60 mL/min
GFR calc non Af Amer: >60 mL/min
Glucose, Bld: 101 mg/dL — ABNORMAL HIGH (ref 65–99)
Potassium: 3.5 mmol/L (ref 3.5–5.1)
Sodium: 138 mmol/L (ref 135–145)
Total Bilirubin: 0.5 mg/dL (ref 0.3–1.2)
Total Protein: 6.5 g/dL (ref 6.5–8.1)

## 2017-10-10 LAB — POC URINE PREG, ED: Preg Test, Ur: POSITIVE — AB

## 2017-10-10 LAB — HCG, QUANTITATIVE, PREGNANCY: hCG, Beta Chain, Quant, S: 7805 m[IU]/mL — ABNORMAL HIGH

## 2017-10-10 LAB — ABO/RH: ABO/RH(D): A POS

## 2017-10-10 NOTE — ED Provider Notes (Signed)
[facility] Provider Note   CSN: [id number] Arrival date & time: 10/10/17  0945     History   Chief Complaint Chief Complaint  Patient presents with  . Vaginal Bleeding    HPI Tonya Porter is a 37 y.o. female with a past medical history of hypothyroidism, toxic nodular goiter, ectopic pregnancy in 2003 status post? Tubal ligation who presented to the ED today complaining of vaginal bleeding. Patient's reports that she has been having her menstrual cycle for the past 3 weeks. She states she initially started spotting on May 29 but now has been passing about 2-3 tablespoons of blood each day since then. She is changing her pad 3 times a day she denies any abdominal or pelvic pain, dysuria. She is sexually active with one female partner and does not use contraception. She states that after her "procedure" was done in 2003 following her ectopic pregnancy that she was told she only had a 10% chance of getting pregnant and most likely would require IVF if she ever tried to do so. She has not been pregnant since that time. She denies any unexplained weight loss. She is up to date on her Pap smear states she had one last year and was told this was normal. She does tell me she feels a "hard" area in her left mid abdomen that she thinks has been there since January.  HPI  Past Medical History:  Diagnosis Date  . Goiter     Patient Active Problem List   Diagnosis Date Noted  . Hypothyroidism following radioiodine therapy 03/23/2017  . Toxic nodular goiter 06/28/2016    History reviewed. No pertinent surgical history.   OB History   None      Home Medications    Prior to Admission medications   Medication Sig Start Date End Date Taking? Authorizing Provider  ibuprofen (ADVIL,MOTRIN) 200 MG tablet Take 400 mg by mouth every 6 (six) hours as needed.   Yes [provider]  levothyroxine (SYNTHROID, LEVOTHROID) 75 MCG tablet TAKE 1 TABLET  EVERY DAY 09/20/17  Yes Reather LittlerKumar, Ajay, MD  loratadine (ALLERGY) 10 MG tablet 10 mg table as needed for allgeries   Yes [provider]    Family History Family History  Problem Relation Age of Onset  . Thyroid disease Neg Hx     Social History Social History   Tobacco Use  . Smoking status: Former Games developermoker  . Smokeless tobacco: Never Used  Substance Use Topics  . Alcohol use: Not on file  . Drug use: Not on file     Allergies   Latex   Review of Systems Review of Systems  All other systems reviewed and are negative.    Physical Exam Updated Vital Signs LMP 09/19/2017 (Within Days)   Physical Exam  Constitutional: She is oriented to person, place, and time. She appears well-developed and well-nourished. No distress.  HENT:  Head: Normocephalic and atraumatic.  Mouth/Throat: No oropharyngeal exudate.  Eyes: Pupils are equal, round, and reactive to light. Conjunctivae and EOM are normal. Right eye exhibits no discharge. Left eye exhibits no discharge. No scleral icterus.  Cardiovascular: Normal rate, regular rhythm, normal heart sounds and intact distal pulses. Exam reveals no gallop and no friction rub.  No murmur heard. Pulmonary/Chest: Effort normal and breath sounds normal. No respiratory distress. She has no wheezes. She has no rales. She exhibits no tenderness.  Abdominal: Soft. She exhibits mass. She exhibits no distension. There is no  tenderness. There is no guarding. Rebound:  LLQ hard, nontender mass.  Musculoskeletal: Normal range of motion. She exhibits no edema.  Neurological: She is alert and oriented to person, place, and time.  Skin: Skin is warm and dry. No rash noted. She is not diaphoretic. No erythema. No pallor.  Psychiatric: She has a normal mood and affect. Her behavior is normal.  Nursing note and vitals reviewed.    ED Treatments / Results  Labs (all labs ordered are listed, but only abnormal results are displayed) Labs Reviewed    COMPREHENSIVE METABOLIC PANEL  CBC WITH DIFFERENTIAL/PLATELET  HCG, QUANTITATIVE, PREGNANCY  POC URINE PREG, ED    EKG None  Radiology No results found.  Procedures Procedures (including critical care time)  Medications Ordered in ED Medications - No data to display   Initial Impression / Assessment and Plan / ED Course  I have reviewed the triage vital signs and the nursing notes.  Pertinent labs & imaging results that were available during my care of the patient were reviewed by me and considered in my medical decision making (see chart for details).     37 year old female presents to the ED today complaining of vaginal bleeding for 3 weeks. No report of pain. She does have a history of an ectopic pregnancy status post? Tubal ligation in 2003. She currently does not use contraception and is sexually active with 1 female partner. She was told that she had a less than 10% chance of getting pregnant. Her hemoglobin is 10.3. Her point of care pregnancy test is actually positive. I will order an hCG quantification. And a transvaginal ultrasound. On exam she has a palpable mass in her left mid abdomen that is non-tender. With her positive pregnancy test question whether this is a gravid uterus vs teratoma vs uterine fibroid vs neoplasm. Will await ultrasound.  US shows no intrauterine gestational sac identified. Findings compatible with pregnancy of unknown location. 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 DDX includes spontaneous abortion versus ectopic pregnancy versus early intrauterine pregnancy too early to visualize.  Patient tells me that she has a appointment to establish new patient care at [facility] with [facility] in september. I called the office and spoke with her nurse, she now has appointment 10:15Am on Thursday 6/20 for follow up hcg quant and repeat ultrasound.. Pt notified of this. I have also ordered Abo/Rh. Pt discharged in stable  condition.  Patient was discussed with and seen by Dr. Jeraldine Loots who agrees with the treatment plan.    Final Clinical Impressions(s) / ED Diagnoses   Final diagnoses:  Vaginal bleeding    ED Discharge Orders    None       Dub Mikes, PA-C 10/10/17 1442    Gerhard Munch, MD 10/10/17 1614

## 2017-10-10 NOTE — ED Triage Notes (Signed)
Pt states she has been having vaginal bleeding X3 weeks. She states she initially thought it was her menstrual cycle. Pt denies any pain. Pt states bleeding is moderate to heavy.

## 2017-10-10 NOTE — ED Notes (Signed)
Patient transported to Ultrasound 

## 2017-10-10 NOTE — Discharge Instructions (Signed)
Follow-up with [facility] at 10:15 AM on June 20. Return to the ED if you experience severe worsening of your symptoms, abdominal pain, fever, loss of consciousness.

## 2017-10-13 ENCOUNTER — Encounter: Payer: Self-pay | Admitting: Obstetrics & Gynecology

## 2017-10-13 ENCOUNTER — Ambulatory Visit (INDEPENDENT_AMBULATORY_CARE_PROVIDER_SITE_OTHER): Payer: Self-pay | Admitting: Obstetrics & Gynecology

## 2017-10-13 ENCOUNTER — Other Ambulatory Visit: Payer: Self-pay

## 2017-10-13 ENCOUNTER — Ambulatory Visit (INDEPENDENT_AMBULATORY_CARE_PROVIDER_SITE_OTHER): Payer: Self-pay

## 2017-10-13 ENCOUNTER — Other Ambulatory Visit: Payer: Self-pay | Admitting: Obstetrics & Gynecology

## 2017-10-13 VITALS — Ht 63.0 in | Wt 149.0 lb

## 2017-10-13 DIAGNOSIS — O3680X0 Pregnancy with inconclusive fetal viability, not applicable or unspecified: Secondary | ICD-10-CM

## 2017-10-13 DIAGNOSIS — O283 Abnormal ultrasonic finding on antenatal screening of mother: Secondary | ICD-10-CM

## 2017-10-13 NOTE — Progress Notes (Signed)
Tonya Porter August 07, 1980 [id number]        37 y.o.  G1P0010  LMP end of April 2019/Vaginal bleeding since 09/19/2017  RP: Early pregnancy with 1st trimester bleeding  HPI:  S/P Rt Salpingectomy for Rt ectopic.  LPS Lysis of adhesions and Left distal tuboplasty (obstructed left hydrosalpinx) in 2004 with me.  No contraception re longstanding infertility/sexually active.  No pelvic pain, no shoulder pain.  Vaginal bleeding started 09/19/2017 as spotting x 3 days, then heavier flow which persisted until now.  Presented to [facility] for persistent vaginal bleeding on 10/10/2017.  Quant BHCG 7805 on 10/10/2017.  Pelvic US with large Uterine Fibroids, no IUP visualized (no GS, no fluid in endometrial cavity), no adnexal mass suspicious of ectopic, but ectopic not ruled out.  No FF in PCS.  Patient was hemodynamically stable with no pain.  She was therefore scheduled for f/u here today.  Blood group/RH: A positive.  Hb 10.3.  WBC 4.3.    OB History  Gravida Para Term Preterm AB Living  1 0     1    SAB TAB Ectopic Multiple Live Births      1        # Outcome Date GA Lbr Len/2nd Weight Sex Delivery Anes PTL Lv  1 Ectopic             Past medical history,surgical history, problem list, medications, allergies, family history and social history were all reviewed and documented in the EPIC chart.   Directed ROS with pertinent positives and negatives documented in the history of present illness/assessment and plan.  Exam:  Vitals:   10/13/17 1038  Weight: 149 lb (67.6 kg)  Height: 5\' 3"  (1.6 m)   General appearance:  Normal  Abdomen: Soft, non-distended, NT  Gynecologic exam: Vulva normal.  Bimanual exam:   Pelvic US today: Status post right salpingectomy.  History of left distal tuboplasty for obstructed left hydrosalpinx in 2004.  Enlarged uterus measuring approximately 22 cm with multiple fibroids.  No intrauterine pregnancy seen.  Left ovarian probable corpus luteum cyst measuring 2.3 x  1.5 x 1.6 cm with color flow Doppler positive.  Right probable ovarian cyst with no color flow seen, measuring 3.2 x 1.7 x 1.8 cm. No free fluid in the posterior cul-de-sac.  Cannot rule out ectopic pregnancy.    Pelvic US 10/10/2017: Maternal uterus/adnexae: Uterus is enlarged at 23.3 x 8.7 x 15.9 cm. Uterus appears heterogeneous with multiple nodules consistent with multiple leiomyomata. These include large leiomyomata measuring up to 11.8 x 8.2 x 11.3 cm, 7.0 x 4.9 x 6.4 cm, 5.8 x 4.1 x 5.2 cm, and 4.8 x 5.5 x 5.4 cm. No gestational sac or endometrial fluid collection. Endometrial complex measures up to 14 mm thick, distorted by leiomyomata. RIGHT ovary measures 4.3 x 2.2 x 2.5 cm contains a small hemorrhagic corpus luteum. LEFT ovary normal size morphology, 4.2 x 2.2 x 2.6 cm. Blood flow present within both ovaries on color Doppler imaging. No endometrial fluid or adnexal masses. IMPRESSION: No intrauterine gestational sac identified. Findings are compatible with pregnancy of unknown location.  Quant BHCG 7805 on 10/10/2017 Quant BHCG repeated today, pending Hb 10.3 on 10/10/2017 Blood group/RH: A positive  Assessment/Plan:  37 y.o. G1P0010   1. Pregnancy, location unknown First trimester pregnancy with vaginal bleeding.  Hemodynamically stable with no pelvic pain or shoulder pain.  Quant beta hCG at 7805 on October 10, 2017.  Pending repeat quant beta hCG today.  Pelvic ultrasound unchanged between June 17 and today.  No evidence of intrauterine pregnancy, no adnexal mass suspicious of ectopic and no free fluid in the posterior cul-de-sac.  Probable complete spontaneous abortion, but ectopic pregnancy not ruled out.  Of note, history of right ectopic pregnancy post right salpingectomy and history of left hydrosalpinx with left distal tuboplasty in 2004.  Given that patient is completely stable hemodynamically, will wait on the quant beta hCG results to decide on further management.  Ectopic precautions  reviewed with patient.  Recommend no sexual activity at this time.  Will need safe contraception once management of this pregnancy is completed. - B-HCG Quant; Future  Counseling on above issues and coordination of care more than 50% for 30 minutes.  Genia DelMarie-Lyne Addilynn Mowrer MD, 11:28 AM 10/13/2017

## 2017-10-13 NOTE — Patient Instructions (Signed)
1. Pregnancy, location unknown First trimester pregnancy with vaginal bleeding.  Hemodynamically stable with no pelvic pain or shoulder pain.  Quant beta hCG at 7805 on October 10, 2017.  Pending repeat quant beta hCG today.  Pelvic ultrasound unchanged between June 17 and today.  No evidence of intrauterine pregnancy, no adnexal mass suspicious of ectopic and no free fluid in the posterior cul-de-sac.  Probable complete spontaneous abortion, but ectopic pregnancy not ruled out.  Of note, history of right ectopic pregnancy post right salpingectomy and history of left hydrosalpinx with left distal tuboplasty in 2004.  Given that patient is completely stable hemodynamically, will wait on the quant beta hCG results to decide on further management.  Ectopic precautions reviewed with patient.  Recommend no sexual activity at this time.  Will need safe contraception once management of this pregnancy is completed. - B-HCG Quant; Future  Tonya Porter, it was a pleasure seeing you today!  I will inform you of your results as soon as they are available.

## 2017-10-14 ENCOUNTER — Inpatient Hospital Stay (HOSPITAL_COMMUNITY)
Admission: AD | Admit: 2017-10-14 | Discharge: 2017-10-14 | Disposition: A | Discharge status: Home / Self Care | Payer: Self-pay | Source: Ambulatory Visit | Attending: Obstetrics & Gynecology | Admitting: Obstetrics & Gynecology

## 2017-10-14 ENCOUNTER — Encounter (HOSPITAL_COMMUNITY): Payer: Self-pay | Admitting: *Deleted

## 2017-10-14 DIAGNOSIS — O00109 Unspecified tubal pregnancy without intrauterine pregnancy: Secondary | ICD-10-CM

## 2017-10-14 DIAGNOSIS — O009 Unspecified ectopic pregnancy without intrauterine pregnancy: Secondary | ICD-10-CM | POA: Insufficient documentation

## 2017-10-14 LAB — CBC WITH DIFFERENTIAL/PLATELET
BASOS ABS: 0 10*3/uL (ref 0.0–0.1)
Basophils Relative: 0 %
Eosinophils Absolute: 0.1 10*3/uL (ref 0.0–0.7)
Eosinophils Relative: 2 %
HEMATOCRIT: 31.8 % — AB (ref 36.0–46.0)
Hemoglobin: 10.6 g/dL — ABNORMAL LOW (ref 12.0–15.0)
LYMPHS PCT: 34 %
Lymphs Abs: 2 10*3/uL (ref 0.7–4.0)
MCH: 27.7 pg (ref 26.0–34.0)
MCHC: 33.3 g/dL (ref 30.0–36.0)
MCV: 83 fL (ref 78.0–100.0)
Monocytes Absolute: 0.4 10*3/uL (ref 0.1–1.0)
Monocytes Relative: 6 %
NEUTROS ABS: 3.3 10*3/uL (ref 1.7–7.7)
Neutrophils Relative %: 58 %
Platelets: 240 10*3/uL (ref 150–400)
RBC: 3.83 MIL/uL — AB (ref 3.87–5.11)
RDW: 14.5 % (ref 11.5–15.5)
WBC: 5.7 10*3/uL (ref 4.0–10.5)

## 2017-10-14 LAB — HCG, QUANTITATIVE, PREGNANCY
HCG, BETA CHAIN, QUANT, S: 13725 m[IU]/mL — AB (ref <5)
HCG, Total, QN: 11542 m[IU]/mL

## 2017-10-14 LAB — CREATININE, SERUM
CREATININE: 0.71 mg/dL (ref 0.44–1.00)
GFR calc Af Amer: >60 mL/min (ref >60)
GFR calc non Af Amer: >60 mL/min (ref >60)

## 2017-10-14 LAB — TYPE AND SCREEN
ABO/RH(D): A POS
Antibody Screen: NEGATIVE

## 2017-10-14 LAB — ABO/RH: ABO/RH(D): A POS

## 2017-10-14 LAB — BUN: BUN: 8 mg/dL (ref 6–20)

## 2017-10-14 LAB — AST: AST: 16 U/L (ref 15–41)

## 2017-10-14 MED ORDER — METHOTREXATE INJECTION FOR WOMEN'S HOSPITAL
50.0000 mg/m2 | Freq: Once | INTRAMUSCULAR | Status: AC
  Administered 2017-10-14: 85 mg via INTRAMUSCULAR
  Filled 2017-10-14: qty 1.7

## 2017-10-14 NOTE — Discharge Instructions (Signed)
Methotrexate injection What is this medicine? METHOTREXATE (METH oh TREX ate) is a chemotherapy drug used to treat cancer including breast cancer, leukemia, and lymphoma. This medicine can also be used to treat psoriasis and certain kinds of arthritis. This medicine may be used for other purposes; ask your health care provider or pharmacist if you have questions. What should I tell my health care provider before I take this medicine? They need to know if you have any of these conditions: -fluid in the stomach area or lungs -if you often drink alcohol -infection or immune system problems -kidney disease -liver disease -low blood counts, like low white cell, platelet, or red cell counts -lung disease -radiation therapy -stomach ulcers -ulcerative colitis -an unusual or allergic reaction to methotrexate, other medicines, foods, dyes, or preservatives -pregnant or trying to get pregnant -breast-feeding How should I use this medicine? This medicine is for infusion into a vein or for injection into muscle or into the spinal fluid (whichever applies). It is usually given by a health care professional in a hospital or clinic setting. In rare cases, you might get this medicine at home. You will be taught how to give this medicine. Use exactly as directed. Take your medicine at regular intervals. Do not take your medicine more often than directed. If this medicine is used for arthritis or psoriasis, it should be taken weekly, NOT daily. It is important that you put your used needles and syringes in a special sharps container. Do not put them in a trash can. If you do not have a sharps container, call your pharmacist or healthcare provider to get one. Talk to your pediatrician regarding the use of this medicine in children. While this drug may be prescribed for children as young as 2 years for selected conditions, precautions do apply. Overdosage: If you think you have taken too much of this medicine  contact a poison control center or emergency room at once. NOTE: This medicine is only for you. Do not share this medicine with others. What if I miss a dose? It is important not to miss your dose. Call your doctor or health care professional if you are unable to keep an appointment. If you give yourself the medicine and you miss a dose, talk with your doctor or health care professional. Do not take double or extra doses. What may interact with this medicine? This medicine may interact with the following medications: -acitretin -aspirin or aspirin-like medicines including salicylates -azathioprine -certain antibiotics like chloramphenicol, penicillin, tetracycline -certain medicines for stomach problems like esomeprazole, omeprazole, pantoprazole -cyclosporine -gold -hydroxychloroquine -live virus vaccines -mercaptopurine -NSAIDs, medicines for pain and inflammation, like ibuprofen or naproxen -other cytotoxic agents -penicillamine -phenylbutazone -phenytoin -probenacid -retinoids such as isotretinoin and tretinoin -steroid medicines like prednisone or cortisone -sulfonamides like sulfasalazine and trimethoprim/sulfamethoxazole -theophylline This list may not describe all possible interactions. Give your health care provider a list of all the medicines, herbs, non-prescription drugs, or dietary supplements you use. Also tell them if you smoke, drink alcohol, or use illegal drugs. Some items may interact with your medicine. What should I watch for while using this medicine? Avoid alcoholic drinks. In some cases, you may be given additional medicines to help with side effects. Follow all directions for their use. This medicine can make you more sensitive to the sun. Keep out of the sun. If you cannot avoid being in the sun, wear protective clothing and use sunscreen. Do not use sun lamps or tanning beds/booths. You may get drowsy  or dizzy. Do not drive, use machinery, or do anything that  needs mental alertness until you know how this medicine affects you. Do not stand or sit up quickly, especially if you are an older patient. This reduces the risk of dizzy or fainting spells. You may need blood work done while you are taking this medicine. Call your doctor or health care professional for advice if you get a fever, chills or sore throat, or other symptoms of a cold or flu. Do not treat yourself. This drug decreases your body's ability to fight infections. Try to avoid being around people who are sick. This medicine may increase your risk to bruise or bleed. Call your doctor or health care professional if you notice any unusual bleeding. Check with your doctor or health care professional if you get an attack of severe diarrhea, nausea and vomiting, or if you sweat a lot. The loss of too much body fluid can make it dangerous for you to take this medicine. Talk to your doctor about your risk of cancer. You may be more at risk for certain types of cancers if you take this medicine. Both men and women must use effective birth control with this medicine. Do not become pregnant while taking this medicine or until at least 1 normal menstrual cycle has occurred after stopping it. Women should inform their doctor if they wish to become pregnant or think they might be pregnant. Men should not father a child while taking this medicine and for 3 months after stopping it. There is a potential for serious side effects to an unborn child. Talk to your health care professional or pharmacist for more information. Do not breast-feed an infant while taking this medicine. What side effects may I notice from receiving this medicine? Side effects that you should report to your doctor or health care professional as soon as possible: -allergic reactions like skin rash, itching or hives, swelling of the face, lips, or tongue -back pain -breathing problems or shortness of breath -confusion -diarrhea -dry,  nonproductive cough -low blood counts - this medicine may decrease the number of white blood cells, red blood cells and platelets. You may be at increased risk of infections and bleeding -mouth sores -redness, blistering, peeling or loosening of the skin, including inside the mouth -seizures -severe headaches -signs of infection - fever or chills, cough, sore throat, pain or difficulty passing urine -signs and symptoms of bleeding such as bloody or black, tarry stools; red or dark-brown urine; spitting up blood or brown material that looks like coffee grounds; red spots on the skin; unusual bruising or bleeding from the eye, gums, or nose -signs and symptoms of kidney injury like trouble passing urine or change in the amount of urine -signs and symptoms of liver injury like dark yellow or brown urine; general ill feeling or flu-like symptoms; light-colored stools; loss of appetite; nausea; right upper belly pain; unusually weak or tired; yellowing of the eyes or skin -stiff neck -vomiting Side effects that usually do not require medical attention (report to your doctor or health care professional if they continue or are bothersome): -dizziness -hair loss -headache -stomach pain -upset stomach This list may not describe all possible side effects. Call your doctor for medical advice about side effects. You may report side effects to FDA at 1-800-FDA-1088. Where should I keep my medicine? If you are using this medicine at home, you will be instructed on how to store this medicine. Throw away any unused medicine after  the expiration date on the label. NOTE: This sheet is a summary. It may not cover all possible information. If you have questions about this medicine, talk to your doctor, pharmacist, or health care provider.  2018 Elsevier/Gold Standard (2014-08-01 12:36:41) Ectopic Pregnancy An ectopic pregnancy is when the fertilized egg attaches (implants) outside the uterus. Most ectopic  pregnancies occur in one of the tubes where eggs travel from the ovary to the uterus (fallopian tubes), but the implanting can occur in other locations. In rare cases, ectopic pregnancies occur on the ovary, intestine, pelvis, abdomen, or cervix. In an ectopic pregnancy, the fertilized egg does not have the ability to develop into a normal, healthy baby. A ruptured ectopic pregnancy is one in which tearing or bursting of a fallopian tube causes internal bleeding. Often, there is intense lower abdominal pain, and vaginal bleeding sometimes occurs. Having an ectopic pregnancy can be life-threatening. If this dangerous condition is not treated, it can lead to blood loss, shock, or even death. What are the causes? The most common cause of this condition is damage to one of the fallopian tubes. A fallopian tube may be narrowed or blocked, and that keeps the fertilized egg from reaching the uterus. What increases the risk? This condition is more likely to develop in women of childbearing age who have different levels of risk. The levels of risk can be divided into three categories. High risk  You have gone through infertility treatment.  You have had an ectopic pregnancy before.  You have had surgery on the fallopian tubes, or another surgical procedure, such as an abortion.  You have had surgery to have the fallopian tubes tied (tubal ligation).  You have problems or diseases of the fallopian tubes.  You have been exposed to diethylstilbestrol (DES). This medicine was used until 1971, and it had effects on babies whose mothers took the medicine.  You become pregnant while using an IUD (intrauterine device) for birth control. Moderate risk  You have a history of infertility.  You have had an STI (sexually transmitted infection).  You have a history of pelvic inflammatory disease (PID).  You have scarring from endometriosis.  You have multiple sexual partners.  You smoke. Low risk  You  have had pelvic surgery.  You use vaginal douches.  You became sexually active before age 38. What are the signs or symptoms? Common symptoms of this condition include normal pregnancy symptoms, such as missing a period, nausea, tiredness, abdominal pain, breast tenderness, and bleeding. However, ectopic pregnancy will have additional symptoms, such as:  Pain with intercourse.  Irregular vaginal bleeding or spotting.  Cramping or pain on one side or in the lower abdomen.  Fast heartbeat, low blood pressure, and sweating.  Passing out while having a bowel movement.  Symptoms of a ruptured ectopic pregnancy and internal bleeding may include:  Sudden, severe pain in the abdomen and pelvis.  Dizziness, weakness, light-headedness, or fainting.  Pain in the shoulder or neck area.  How is this diagnosed? This condition is diagnosed by:  A pelvic exam to locate pain or a mass in the abdomen.  A pregnancy test. This blood test checks for the presence as well as the specific level of pregnancy hormone in the bloodstream.  Ultrasound. This is performed if a pregnancy test is positive. In this test, a probe is inserted into the vagina. The probe will detect a fetus, possibly in a location other than the uterus.  Taking a sample of uterus tissue (dilation  and curettage, or D&C).  Surgery to perform a visual exam of the inside of the abdomen using a thin, lighted tube that has a tiny camera on the end (laparoscope).  Culdocentesis. This procedure involves inserting a needle at the top of the vagina, behind the uterus. If blood is present in this area, it may indicate that a fallopian tube is torn.  How is this treated? This condition is treated with medicine or surgery. Medicine  An injection of a medicine (methotrexate) may be given to cause the pregnancy tissue to be absorbed. This medicine may save your fallopian tube. It may be given if: ? The diagnosis is made early, with no  signs of active bleeding. ? The fallopian tube has not ruptured. ? You are considered to be a good candidate for the medicine. Usually, pregnancy hormone blood levels are checked after methotrexate treatment. This is to be sure that the medicine is effective. It may take 4-6 weeks for the pregnancy to be absorbed. Most pregnancies will be absorbed by 3 weeks. Surgery  A laparoscope may be used to remove the pregnancy tissue.  If severe internal bleeding occurs, a larger cut (incision) may be made in the lower abdomen (laparotomy) to remove the fetus and placenta. This is done to stop the bleeding.  Part or all of the fallopian tube may be removed (salpingectomy) along with the fetus and placenta. The fallopian tube may also be repaired during the surgery.  In very rare circumstances, removal of the uterus (hysterectomy) may be required.  After surgery, pregnancy hormone testing may be done to be sure that there is no pregnancy tissue left. Whether your treatment is medicine or surgery, you may receive a Rho (D) immune globulin shot to prevent problems with any future pregnancy. This shot may be given if:  You are Rh-negative and the baby's father is Rh-positive.  You are Rh-negative and you do not know the Rh type of the baby's father.  Follow these instructions at home:  Rest and limit your activity after the procedure for as long as told by your health care provider.  Until your health care provider says that it is safe: ? Do not lift anything that is heavier than 10 lb (4.5 kg), or the limit that your health care provider tells you. ? Avoid physical exercise and any movement that requires effort (is strenuous).  To help prevent constipation: ? Eat a healthy diet that includes fruits, vegetables, and whole grains. ? Drink 6-8 glasses of water per day. Get help right away if:  You develop worsening pain that is not relieved by medicine.  You have: ? A fever or chills. ? Vaginal  bleeding. ? Redness and swelling at the incision site. ? Nausea and vomiting.  You feel dizzy or weak.  You feel light-headed or you faint. This information is not intended to replace advice given to you by your health care provider. Make sure you discuss any questions you have with your health care provider. Document Released: 05/20/2004 Document Revised: 12/10/2015 Document Reviewed: 11/12/2015 Elsevier Interactive Patient Education  [employer]2018 Elsevier Inc.

## 2017-10-14 NOTE — MAU Note (Signed)
Labs called to Dr Seymour BarsLavoie and ok to proceed with MTX. Pt to f/u in MAU Monday Day #4 labs after MTX

## 2017-10-14 NOTE — MAU Note (Addendum)
Pt seen in Dr. Sharol RousselLavoie's office, sent to MAU for MTX.  Pt denies pain, has intermittent bleeding.  Patient called and informed of Quant BHCG result.  Given abnormal rise of Quant BHCG at 11,500 with no IUP by US, therefore Dx of ectopic pregnancy.  Patient explained the risks of ectopic pregnancy, especially severe hemorrhage which could cause death.  Methotrexate treatment vs surgery discussed with patient.  She is stable with no pain, Quant is at 11,500 and no EP/FHR were seen, as well as no FF in the pelvis, therefore, Methotrexate treatment was recommended.  Informed consent obtained.  If labs wnl, will proceed with Methotrexate injection today and F/U with Quant BHCG until negative per protocole.  Patient informed that a second dose of Methotrexate is sometimes necessary.  Ectopic precautions reviewed.  Patient voiced understanding and agreement with the plan of care.

## 2017-10-17 ENCOUNTER — Inpatient Hospital Stay (HOSPITAL_COMMUNITY)
Admission: AD | Admit: 2017-10-17 | Discharge: 2017-10-17 | Disposition: A | Discharge status: Home / Self Care | Payer: Self-pay | Source: Ambulatory Visit | Attending: Obstetrics and Gynecology | Admitting: Obstetrics and Gynecology

## 2017-10-17 DIAGNOSIS — O009 Unspecified ectopic pregnancy without intrauterine pregnancy: Secondary | ICD-10-CM | POA: Insufficient documentation

## 2017-10-17 LAB — HCG, QUANTITATIVE, PREGNANCY: hCG, Beta Chain, Quant, S: 15193 m[IU]/mL — ABNORMAL HIGH (ref <5)

## 2017-10-17 NOTE — MAU Provider Note (Signed)
S: 37 y.o. G2P0010 @Unknown  by LMP presents to MAU for repeat hcg, Day 4 labs following MTX therapy for ectopic pregnancy.  She denies abdominal pain and reports light vaginal bleeding today, which is lighter today than on previous days.    Her quant hcg on 6/17 was 7805 then on 6/20 was 11,542 and then on 6/21 was [id number] and ultrasound showed no IUP.  HPI  O: LMP 09/21/2017   VS reviewed, nursing note reviewed,  Constitutional: well developed, well nourished, no distress HEENT: normocephalic CV: normal rate Pulm/chest wall: normal effort Abdomen: soft Neuro: alert and oriented x 3 Skin: warm, dry Psych: affect normal  No results found for this or any previous visit (from the past 24 hour(s)).  --/--/A POS, A POS Performed at [facility], [address] [place], [place] [address]  [phone](06/21 1859)  MDM: Ordered labs. Pt is stable today without pain and minimal bleeding.  She has f/u appointment in the office tomorrow.  Consult Dr Oscar LaJertson with assessment and findings. Ok for pt to be discharged from MAU with ectopic precautions and f/u in office tomorrow for results. Pt stable at time of discharge.  A:  1. Ectopic pregnancy without intrauterine pregnancy, unspecified location     P: D/C home with ectopic/bleeding precautions F/U on 6/25 as scheduled Return to MAU as needed for emergencies  LEFTWICH-KIRBY, Zamier Eggebrecht, CNM 2:18 PM

## 2017-10-17 NOTE — MAU Note (Signed)
PT SAYS VAG BLEEDING  IS LESS THAN  ON 6-21.  HAS PAD ON -  IN TRIAGE- DARK RED- QUARTER SIZE SPOT .  NO PAIN

## 2017-10-18 ENCOUNTER — Encounter: Payer: Self-pay | Admitting: Obstetrics & Gynecology

## 2017-10-18 ENCOUNTER — Ambulatory Visit (INDEPENDENT_AMBULATORY_CARE_PROVIDER_SITE_OTHER): Payer: Self-pay | Admitting: Obstetrics & Gynecology

## 2017-10-18 VITALS — BP 128/88

## 2017-10-18 DIAGNOSIS — O009 Unspecified ectopic pregnancy without intrauterine pregnancy: Secondary | ICD-10-CM

## 2017-10-18 NOTE — Patient Instructions (Signed)
1. Ectopic pregnancy without intrauterine pregnancy, unspecified location Hemodynamically stable with an unchanged gynecologic exam.  Minimal increase in quant beta hCG usual right after methotrexate.  Patient will repeat quantitative beta-hCG on day 7, October 20 2017.  If decreasing normally, will continue with quant beta hCG every week until negative.  Contraception discussed with patient today, will probably start on birth control pills when quant becomes negative.  Will be interested in IVF in the coming year, will refer to Tonya Porter at follow-up in August.  Pain and bleeding precautions associated with ectopic pregnancy post methotrexate reviewed with patient.  Tonya Porter, good seeing you today!

## 2017-10-18 NOTE — Progress Notes (Signed)
    Tonya BaltimoreLakisha Porter 08/13/1980 [id number]        37 y.o.  G2P0010 Stable boyfriend  RP: Ectopic pregnancy post Methotrexate  HPI: Prolonged vaginal bleeding with abnormal rise in quant beta hCG.  No intrauterine pregnancy while the quant beta hCG was 11,542 on October 13, 2017.  [facility] and received methotrexate on October 14, 2017.  The pre-methotrexate quant beta hCG level on June 21 was at 13,725.  Patient has remained well post methotrexate with just a mild pain sensation towards the lower back.  No pains towards her shoulders.  No dizziness or fainting.  Vaginal bleeding has remained mild, even decreased today.  The quant beta hCG on day 4 of methotrexate June 24 was at 15,193.     OB History  Gravida Para Term Preterm AB Living  2 0     1    SAB TAB Ectopic Multiple Live Births      1        # Outcome Date GA Lbr Len/2nd Weight Sex Delivery Anes PTL Lv  2 Current           1 Ectopic             Past medical history,surgical history, problem list, medications, allergies, family history and social history were all reviewed and documented in the EPIC chart.   Directed ROS with pertinent positives and negatives documented in the history of present illness/assessment and plan.  Exam:  Vitals:   10/18/17 1508  BP: 128/88   General appearance:  Normal  Abdomen: Soft, not distended, NT  Gynecologic exam: Vulva normal.  Bimanual exam:  Uterus enlarged, up to the umbilicus, nodular, NT.  No adnexal mass felt, NT.   Assessment/Plan:  37 y.o. G2P0010   1. Ectopic pregnancy without intrauterine pregnancy, unspecified location Hemodynamically stable with an unchanged gynecologic exam.  Minimal increase in quant beta hCG usual right after methotrexate.  Patient will repeat quantitative beta-hCG on day 7, October 20 2017.  If decreasing normally, will continue with quant beta hCG every week until negative.  Contraception discussed with patient today,  will probably start on birth control pills when quant becomes negative.  Will be interested in IVF in the coming year, will refer to Dr. April MansonYalcinkaya at follow-up in August.  Pain and bleeding precautions associated with ectopic pregnancy post methotrexate reviewed with patient.  Counseling on above issues and coordination of care more than 50% for 15 minutes.  Tonya DelMarie-Lyne Akiyah Eppolito MD, 3:30 PM 10/18/2017

## 2017-10-20 ENCOUNTER — Inpatient Hospital Stay (HOSPITAL_COMMUNITY)
Admission: AD | Admit: 2017-10-20 | Discharge: 2017-10-20 | Disposition: A | Discharge status: Home / Self Care | Payer: Self-pay | Attending: Obstetrics & Gynecology | Admitting: Obstetrics & Gynecology

## 2017-10-20 ENCOUNTER — Other Ambulatory Visit: Payer: Self-pay

## 2017-10-20 DIAGNOSIS — O009 Unspecified ectopic pregnancy without intrauterine pregnancy: Secondary | ICD-10-CM | POA: Insufficient documentation

## 2017-10-20 DIAGNOSIS — O00101 Right tubal pregnancy without intrauterine pregnancy: Secondary | ICD-10-CM

## 2017-10-20 DIAGNOSIS — O3680X0 Pregnancy with inconclusive fetal viability, not applicable or unspecified: Secondary | ICD-10-CM

## 2017-10-20 LAB — HCG, QUANTITATIVE, PREGNANCY: hCG, Beta Chain, Quant, S: 12151 m[IU]/mL — ABNORMAL HIGH (ref <5)

## 2017-10-20 NOTE — MAU Provider Note (Signed)
Ms. Tonya Porter  is a 37 y.o. G2P0010 who presents to the [facility] today for follow-up quant hCG as Day 7 post Methotrexate.  The patient was seen by Dr Seymour BarsLavoie on 10/13/17  US showed suspicion for right complex mass.  HCG levels for Day 0, 4, and 7 (today) are below:   Ref. Range 10/14/2017 18:59 10/14/2017 18:59 10/17/2017 19:33 10/20/2017 22:03  HCG, Beta Chain, Quant, S Latest Ref Range: <5 mIU/mL 13,725 (H)  15,193 (H) 12,151 (H)   She denies pelvic pain Denies vaginal bleeding  Denies fever    OB History  Gravida Para Term Preterm AB Living  2 0     1    SAB TAB Ectopic Multiple Live Births      1        # Outcome Date GA Lbr Len/2nd Weight Sex Delivery Anes PTL Lv  2 Current           1 Ectopic             Past Medical History:  Diagnosis Date  . Goiter      BP 129/74 (BP Location: Right Arm)   Pulse 94   Temp 98.5 F (36.9 C) (Oral)   Resp 18   Ht 5\' 3"  (1.6 m)   Wt 152 lb (68.9 kg)   LMP 09/21/2017   SpO2 100%   BMI 26.93 kg/m   CONSTITUTIONAL: Well-developed, well-nourished female in no acute distress.  MUSCULOSKELETAL: Normal range of motion.  CARDIOVASCULAR: Regular heart rate RESPIRATORY: Normal effort NEUROLOGICAL: Alert and oriented to person, place, and time.  SKIN: Skin is warm and dry. No rash noted. Not diaphoretic. No erythema. No pallor. PSYCH: Normal mood and affect. Normal behavior. Normal judgment and thought content.  Results for orders placed or performed during the hospital encounter of 10/20/17 (from the past 24 hour(s))  hCG, quantitative, pregnancy     Status: Abnormal   Collection Time: 10/20/17 10:03 PM  Result Value Ref Range   hCG, Beta Chain, Quant, S 12,151 (H) <5 mIU/mL    A: Day # 7 post Methotrexate for suspected ectopic pregnancy Appropriate fall in quant hCG   P: Discharge home ectopic precautions reviewed Patient will return for follow-up HCG in office in 1 week.  Patient may return to MAU as needed  or if her condition were to change or worsen   Valora PiccoloWilliams, Marie L, CNM 10/20/2017 11:13 PM

## 2017-10-20 NOTE — MAU Note (Signed)
Pt here for follow hcg. Denies pain or new bleeding.

## 2017-10-20 NOTE — Discharge Instructions (Signed)
Ectopic Pregnancy °An ectopic pregnancy happens when a fertilized egg grows outside the uterus. A pregnancy cannot live outside of the uterus. This problem often happens in the fallopian tube. It is often caused by damage to the fallopian tube. °If this problem is found early, you may be treated with medicine. If your tube tears or bursts open (ruptures), you will bleed inside. This is an emergency. You will need surgery. Get help right away. °What are the signs or symptoms? °You may have normal pregnancy symptoms at first. These include: °· Missing your period. °· Feeling sick to your stomach (nauseous). °· Being tired. °· Having tender breasts. ° °Then, you may start to have symptoms that are not normal. These include: °· Pain with sex (intercourse). °· Bleeding from the vagina. This includes light bleeding (spotting). °· Belly (abdomen) or lower belly cramping or pain. This may be felt on one side. °· A fast heartbeat (pulse). °· Passing out (fainting) after going poop (bowel movement). ° °If your tube tears, you may have symptoms such as: °· Really bad pain in the belly or lower belly. This happens suddenly. °· Dizziness. °· Passing out. °· Shoulder pain. ° °Get help right away if: °You have any of these symptoms. This is an emergency. °This information is not intended to replace advice given to you by your health care provider. Make sure you discuss any questions you have with your health care provider. °Document Released: 07/09/2008 Document Revised: 09/18/2015 Document Reviewed: 11/22/2012 °Elsevier Interactive Patient Education © 2017 [employer] ° °

## 2017-10-28 ENCOUNTER — Telehealth: Payer: Self-pay

## 2017-10-28 ENCOUNTER — Other Ambulatory Visit: Payer: Self-pay

## 2017-10-28 DIAGNOSIS — O009 Unspecified ectopic pregnancy without intrauterine pregnancy: Secondary | ICD-10-CM

## 2017-10-28 NOTE — Telephone Encounter (Signed)
I received staff message from Dr. Mackey BirchwoodML "Please call patient to make sure she is doing well and make sure she has a Quant BHCG done today."  I called patient and left message in voice mail asking her to call me.

## 2017-10-28 NOTE — Telephone Encounter (Signed)
Patient called me back. She said she is going well. She will come at 1:30pm today and have quant drawn. Order placed and lab appointment scheduled.

## 2017-10-29 LAB — HCG, QUANTITATIVE, PREGNANCY: HCG, Total, QN: 2060 m[IU]/mL

## 2017-10-31 ENCOUNTER — Ambulatory Visit: Payer: Self-pay | Admitting: Obstetrics & Gynecology

## 2017-11-03 ENCOUNTER — Other Ambulatory Visit: Payer: Self-pay | Admitting: Anesthesiology

## 2017-11-03 ENCOUNTER — Other Ambulatory Visit: Payer: Self-pay

## 2017-11-03 DIAGNOSIS — O009 Unspecified ectopic pregnancy without intrauterine pregnancy: Secondary | ICD-10-CM

## 2017-11-04 ENCOUNTER — Encounter (INDEPENDENT_AMBULATORY_CARE_PROVIDER_SITE_OTHER): Payer: Self-pay

## 2017-11-04 LAB — HCG, QUANTITATIVE, PREGNANCY: HCG, Total, QN: 838 m[IU]/mL

## 2017-11-10 ENCOUNTER — Other Ambulatory Visit: Payer: Self-pay

## 2017-11-10 ENCOUNTER — Other Ambulatory Visit: Payer: Self-pay | Admitting: Anesthesiology

## 2017-11-10 DIAGNOSIS — O009 Unspecified ectopic pregnancy without intrauterine pregnancy: Secondary | ICD-10-CM

## 2017-11-11 LAB — HCG, QUANTITATIVE, PREGNANCY: HCG, TOTAL, QN: 192 m[IU]/mL

## 2017-11-16 ENCOUNTER — Other Ambulatory Visit: Payer: Self-pay

## 2017-11-16 ENCOUNTER — Other Ambulatory Visit: Payer: Self-pay | Admitting: Obstetrics & Gynecology

## 2017-11-16 DIAGNOSIS — O009 Unspecified ectopic pregnancy without intrauterine pregnancy: Secondary | ICD-10-CM

## 2017-11-16 IMAGING — US US THYROID
1 series · 13 of 25 positions shown · non-contrast
Comparison: 03/18/2009, 02/24/2009

CLINICAL DATA: Thyromegaly. Previous FNA biopsy of 2 right nodules
03/18/2009.

EXAM:
THYROID ULTRASOUND
TECHNIQUE: Ultrasound examination of the thyroid gland and adjacent soft
tissues was performed.

[Series 1: us thyroid · 0.04mm/px · 13 of 80 slices shown]
[im 1/80]
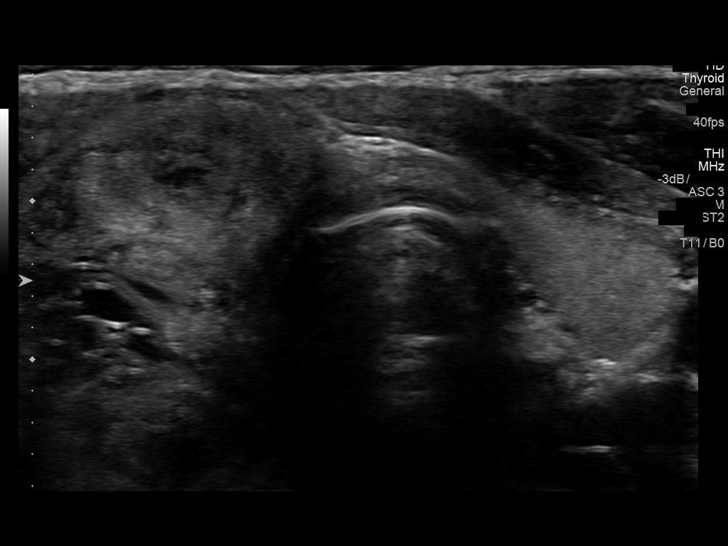
[im 7/80]
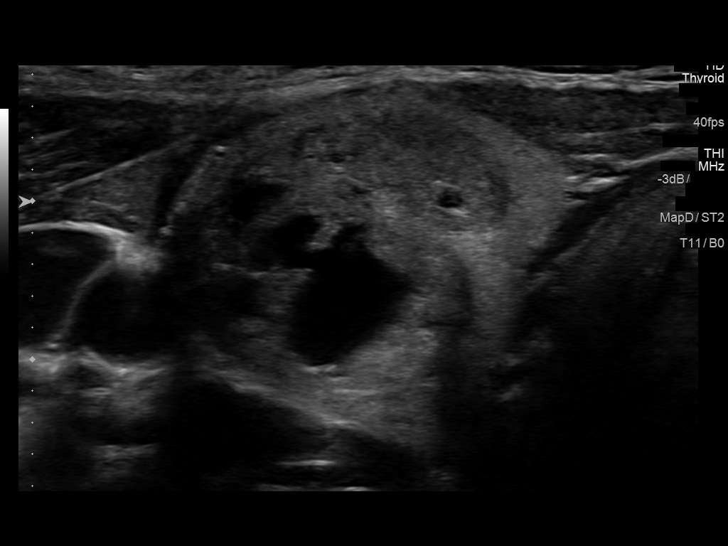
[im 14/80]
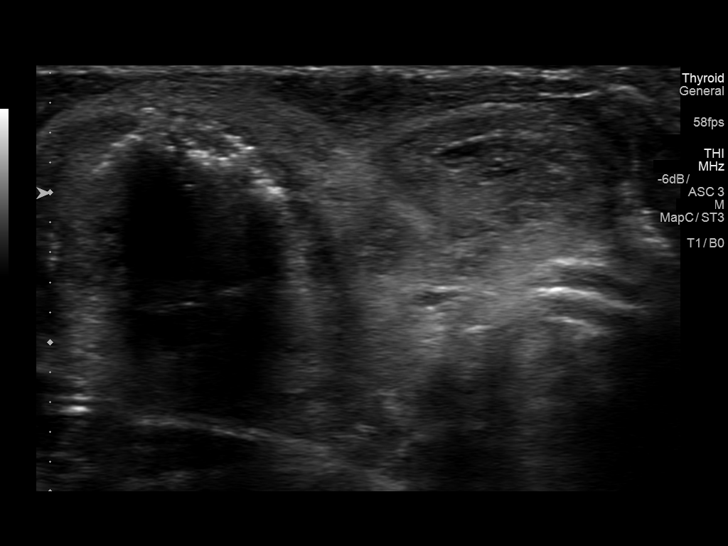
[im 20/80]
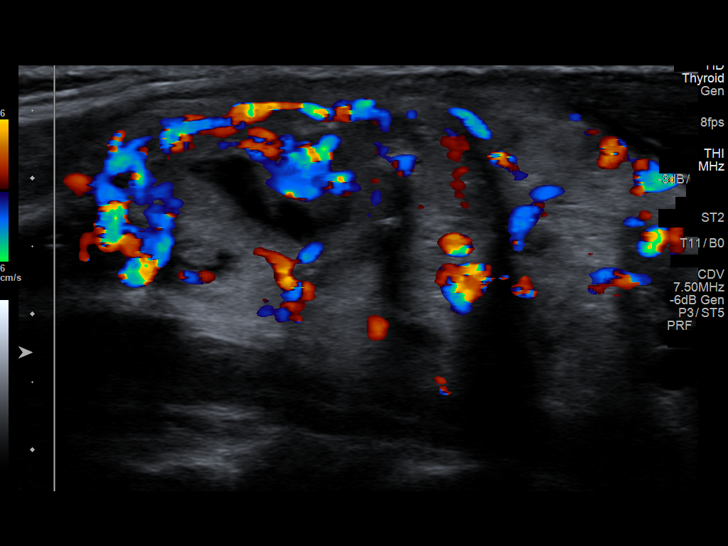
[im 27/80]
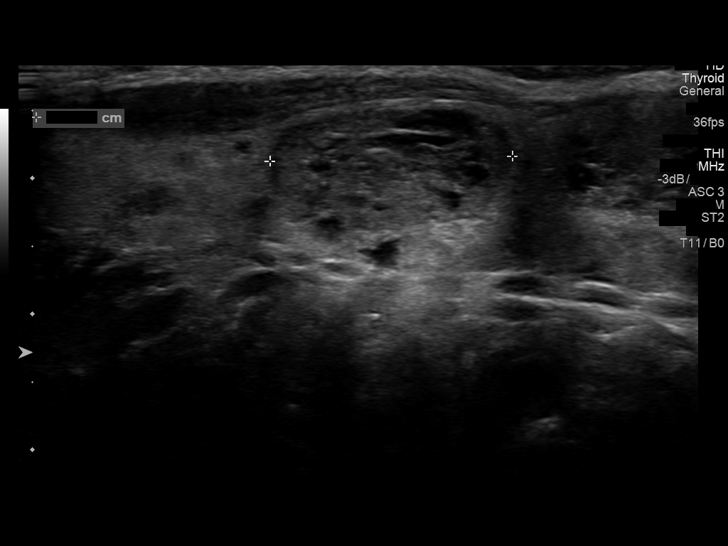
[im 33/80]
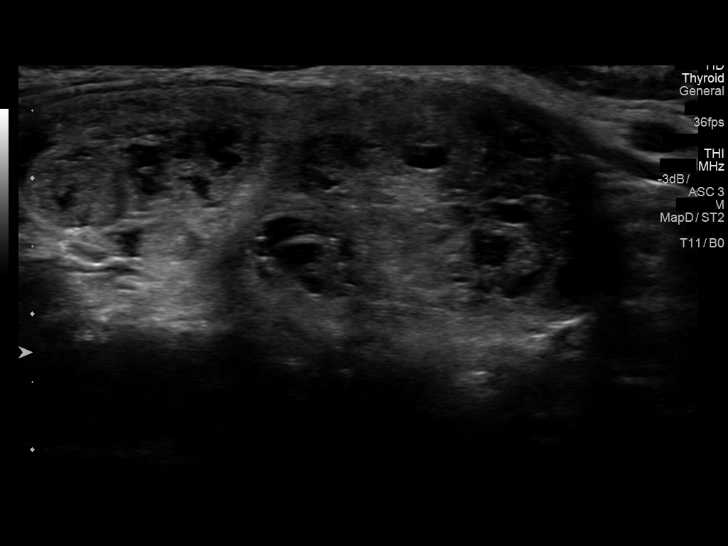
[im 40/80]
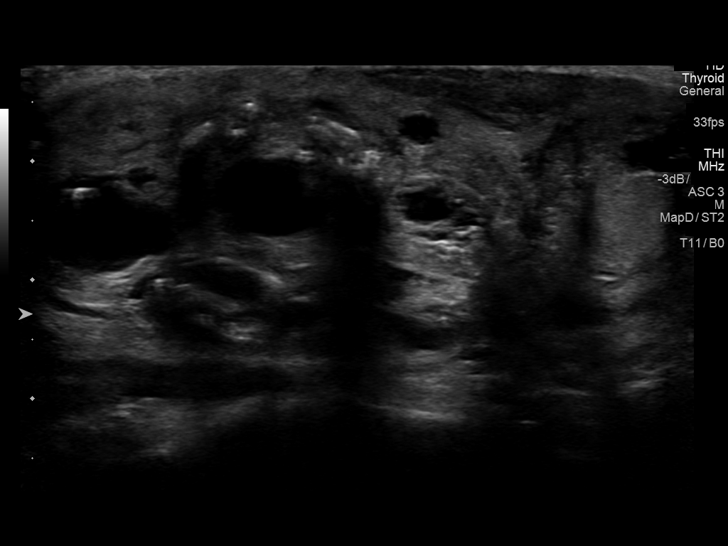
[im 47/80]
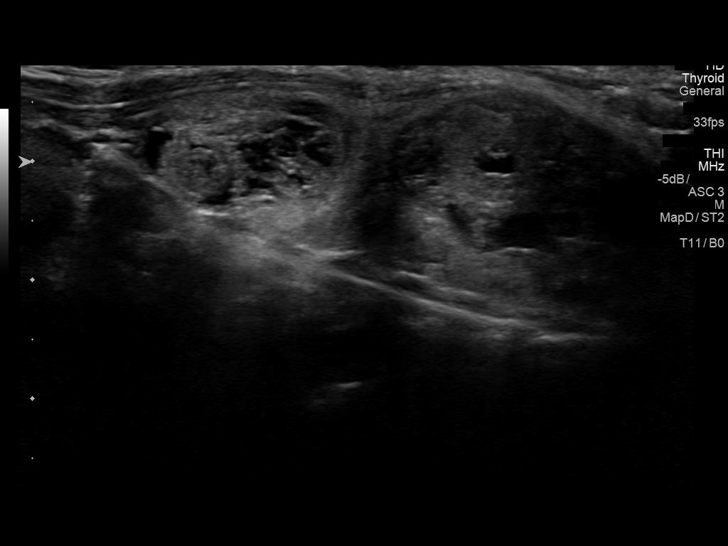
[im 53/80]
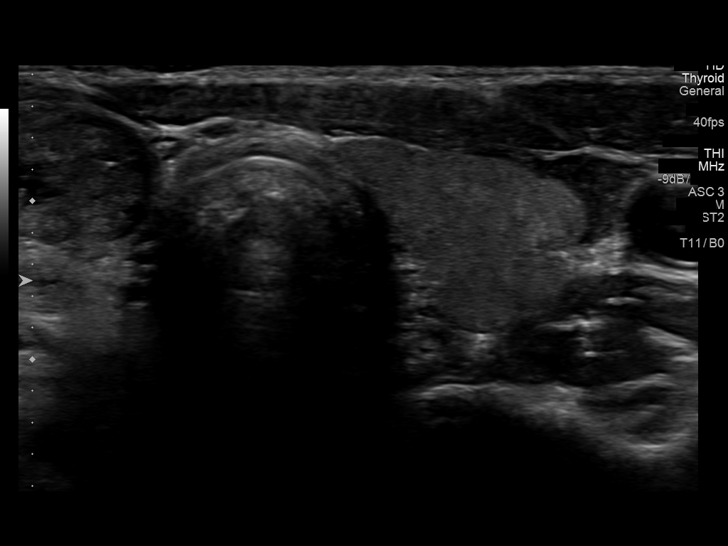
[im 60/80]
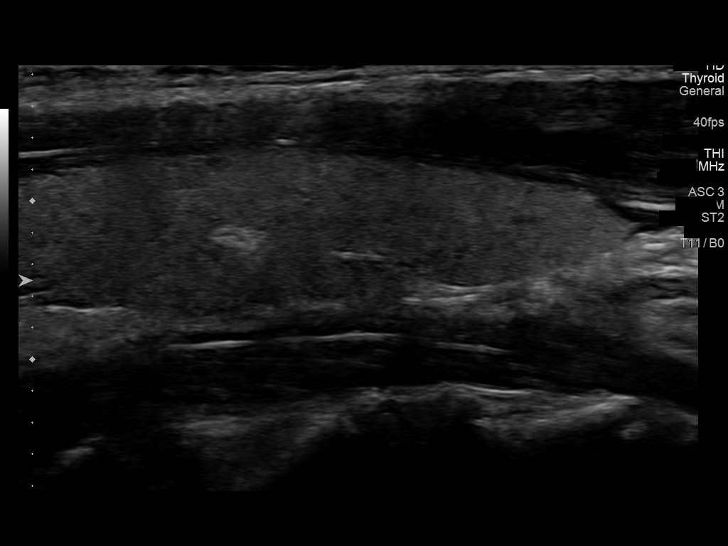
[im 66/80]
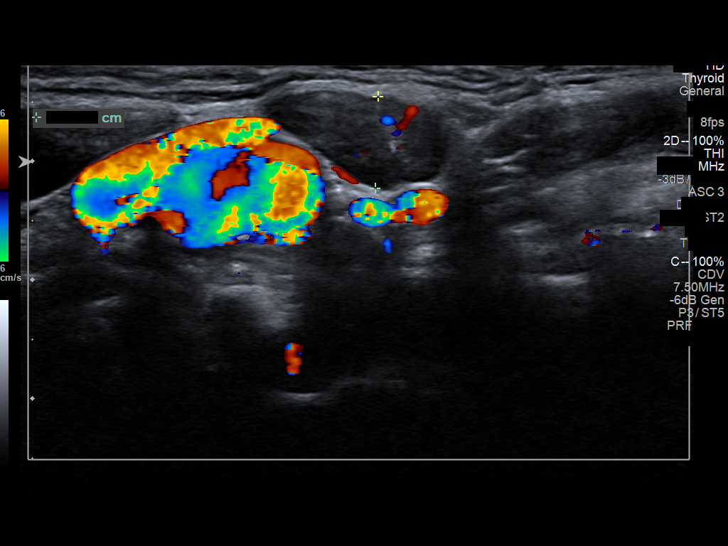
[im 73/80]
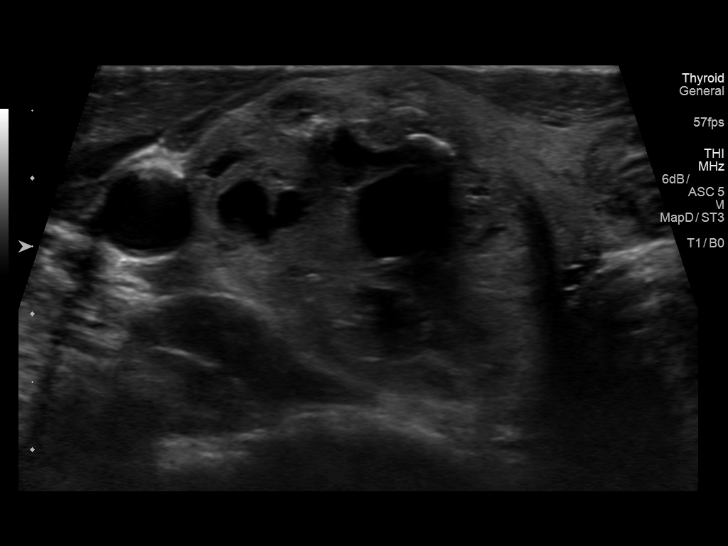
[im 80/80]
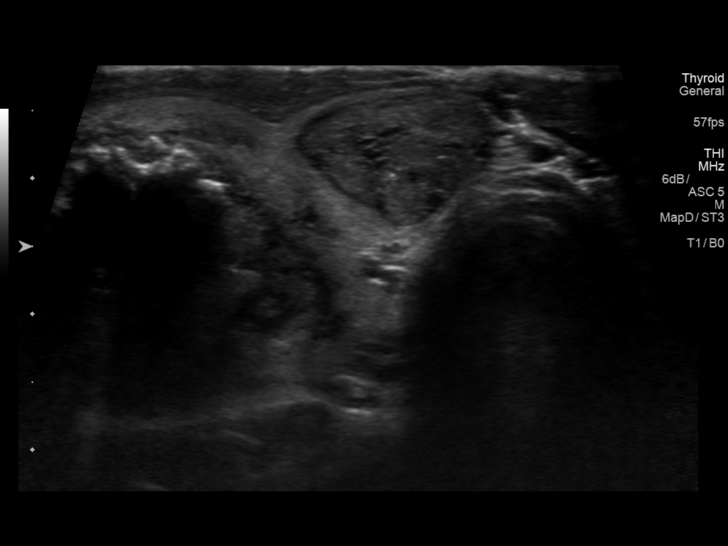

[13 of 25 positions shown; findings below may reference images not displayed]

FINDINGS: Parenchymal Echotexture: Moderately heterogenous

Isthmus: 0.4 cm thickness

Right lobe: 7.7 x 2.8 x 3.1 cm (previously 7.6 x 2 x 2.7)

Left lobe: 4.5 x 1.1 x 1.3 cm (previously 4.2 x 1.1 x 1.2)

_________________________________________________________

Estimated total number of nodules >/= 1 cm: 4

Number of spongiform nodules >/=  2 cm not described below (TR1): 0

Number of mixed cystic and solid nodules >/= 1.5 cm not described
below (TR2): 0

Nodule # 1:

Location: Right; Superior

Maximum size: 1.4 cm; Other 2 dimensions: 1.3 x 0.7 cm

Composition: solid/almost completely solid (2)

Echogenicity: hypoechoic (2)

Shape: not taller-than-wide (0)

Margins: smooth (0)

Echogenic foci: none (0)

ACR TI-RADS total points: 4.

ACR TI-RADS risk category: TR4 (4-6 points).

ACR TI-RADS recommendations:

*Given size (>/= 1 - 1.4 cm) and appearance, a follow-up ultrasound
in 1 year should be considered based on TI-RADS criteria.

Nodule # 2:

Location: Right; Mid near isthmus

Maximum size: 1.8 cm; Other 2 dimensions: 0.8 x 1.5 cm

Composition: solid/almost completely solid (2)

Echogenicity: hypoechoic (2)

Shape: not taller-than-wide (0)

Margins: smooth (0)

Echogenic foci: none (0)

ACR TI-RADS total points: 4.

ACR TI-RADS risk category: TR4 (4-6 points).

ACR TI-RADS recommendations:

**Given size (>/= 1.5 cm) and appearance, fine needle aspiration of
this moderately suspicious nodule should be considered based on
TI-RADS criteria.

2.8 x 2.1 x 2.6 cm complex nodule with coarse calcifications, mid
right; this was previously biopsied.

2.3 x 1.7 x 2.3 cm complex mostly solid nodule, inferior pole right;
this was previously biopsied
IMPRESSION: 1. Thyromegaly, largely stable.
2. Recommend FNA biopsy of 1.8 cm medial right nodule.
3. Recommend 1 year follow-up ultrasound of 1.4 cm superior right
nodule.

The above is in keeping with the ACR TI-RADS recommendations - [HOSPITAL] 7624;[DATE].

## 2017-11-18 ENCOUNTER — Other Ambulatory Visit: Payer: Self-pay

## 2017-11-18 DIAGNOSIS — O009 Unspecified ectopic pregnancy without intrauterine pregnancy: Secondary | ICD-10-CM

## 2017-11-19 LAB — HCG, QUANTITATIVE, PREGNANCY: HCG, TOTAL, QN: 20 m[IU]/mL

## 2017-11-20 ENCOUNTER — Other Ambulatory Visit: Payer: Self-pay | Admitting: Endocrinology

## 2017-11-21 ENCOUNTER — Other Ambulatory Visit: Payer: Self-pay | Admitting: Obstetrics & Gynecology

## 2017-11-21 DIAGNOSIS — O009 Unspecified ectopic pregnancy without intrauterine pregnancy: Secondary | ICD-10-CM

## 2017-11-24 ENCOUNTER — Other Ambulatory Visit: Payer: Self-pay

## 2017-11-24 ENCOUNTER — Ambulatory Visit: Payer: Self-pay | Admitting: Obstetrics & Gynecology

## 2017-11-24 DIAGNOSIS — O009 Unspecified ectopic pregnancy without intrauterine pregnancy: Secondary | ICD-10-CM

## 2017-11-25 ENCOUNTER — Other Ambulatory Visit: Payer: Self-pay

## 2017-11-25 ENCOUNTER — Encounter: Payer: Self-pay | Admitting: *Deleted

## 2017-11-25 LAB — HCG, QUANTITATIVE, PREGNANCY: HCG, Total, QN: 2 m[IU]/mL

## 2017-12-05 ENCOUNTER — Ambulatory Visit: Payer: Self-pay | Admitting: Obstetrics & Gynecology

## 2017-12-22 ENCOUNTER — Other Ambulatory Visit: Payer: Self-pay | Admitting: Endocrinology

## 2018-01-23 ENCOUNTER — Encounter: Payer: Self-pay | Admitting: Obstetrics & Gynecology

## 2018-01-30 ENCOUNTER — Other Ambulatory Visit: Payer: Self-pay | Admitting: Endocrinology

## 2018-02-21 ENCOUNTER — Ambulatory Visit: Payer: Self-pay | Admitting: Obstetrics & Gynecology

## 2018-03-05 ENCOUNTER — Other Ambulatory Visit: Payer: Self-pay | Admitting: Endocrinology

## 2018-04-03 ENCOUNTER — Other Ambulatory Visit: Payer: Self-pay

## 2018-04-05 ENCOUNTER — Other Ambulatory Visit: Payer: Self-pay | Admitting: Endocrinology

## 2018-04-06 ENCOUNTER — Ambulatory Visit: Payer: Self-pay | Admitting: Endocrinology

## 2018-04-12 ENCOUNTER — Encounter: Payer: Self-pay | Admitting: Obstetrics & Gynecology

## 2018-05-08 ENCOUNTER — Other Ambulatory Visit: Payer: Self-pay | Admitting: Endocrinology

## 2018-05-08 ENCOUNTER — Other Ambulatory Visit: Payer: Self-pay

## 2018-05-10 ENCOUNTER — Other Ambulatory Visit: Payer: Self-pay

## 2018-05-11 ENCOUNTER — Other Ambulatory Visit (INDEPENDENT_AMBULATORY_CARE_PROVIDER_SITE_OTHER): Payer: PRIVATE HEALTH INSURANCE

## 2018-05-11 ENCOUNTER — Ambulatory Visit: Payer: Self-pay | Admitting: Endocrinology

## 2018-05-11 DIAGNOSIS — E890 Postprocedural hypothyroidism: Secondary | ICD-10-CM

## 2018-05-11 LAB — T4, FREE: Free T4: 0.78 ng/dL (ref 0.60–1.60)

## 2018-05-11 LAB — T3, FREE: T3, Free: 2.6 pg/mL (ref 2.3–4.2)

## 2018-05-11 LAB — TSH: TSH: 0.86 u[IU]/mL (ref 0.35–4.50)

## 2018-05-12 ENCOUNTER — Ambulatory Visit (INDEPENDENT_AMBULATORY_CARE_PROVIDER_SITE_OTHER): Payer: PRIVATE HEALTH INSURANCE | Admitting: Endocrinology

## 2018-05-12 ENCOUNTER — Encounter: Payer: Self-pay | Admitting: Endocrinology

## 2018-05-12 VITALS — BP 118/76 | HR 81 | Ht 63.0 in | Wt 146.4 lb

## 2018-05-12 DIAGNOSIS — E890 Postprocedural hypothyroidism: Secondary | ICD-10-CM

## 2018-05-12 NOTE — Progress Notes (Signed)
Patient ID: Tonya Porter, female   DOB: [birth year], 38 y.o.   MRN: [id number]                                                                                                                Reason for Appointment:  follow-up of thyroid    History of Present Illness:   Background information on initial consultation:  For the last  patient 2-3 years she has had increased warmth and sweating; she is sometimes sweating excessively at night and wetting her clothes Occasionally also she will have a feeling of the heart palpitating. She tends to get shaky sometimes when she is doing some activities For the last year or so she has been waking up feeling tired and has increased fatigability also  She had been seen by her PCP in 1/18 for a general physical examination was also complaining of the right side of her neck swelling more than before She is apparently had thyroid nodules since about 2010 She was evaluated with ultrasound and needle aspiration biopsies at that time which were benign However she has not followed up for this problem subsequently She does not complain of any difficulty swallowing but occasionally may feel a little pressure in her neck, no difficulty breathing  Report of ultrasound: See end of the note Needle aspiration biopsy done of the nodule in the Right Mid lobe near isthmus showed benign follicular nodule Prior biopsy reports from 2010: FOLLICULAR LESION. SEE COMMENT. Comment: The differential diagnosis includes a partially cystic adenomatousnodule and a hyperplastic lesion. 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4 kg)  10/20/17 152 lb (68.9 kg)  10/14/17 147 lb 1.9 oz (66.7 kg)     Thyroid function tests as follows:    Baseline Free T3 on 05/20/16 was 4.05, normal <3.90   Lab Results  Component Value Date   TSH 0.86 05/11/2018   TSH 0.55 09/28/2017   TSH 0.72 03/21/2017   FREET4 0.78 05/11/2018   FREET4 1.10 09/28/2017   FREET4 0.84 03/21/2017      No results found for: THYROTRECAB   Allergies as of 05/12/2018      Reactions   Latex Hives      Medication List       Accurate as of May 12, 2018  9:49 AM. Always use your most recent  med list.        ALLERGY 10 MG tablet Generic drug:  loratadine 10 mg table as needed for allgeries   levothyroxine 75 MCG tablet Commonly known as:  SYNTHROID, LEVOTHROID TAKE 1 TABLET BY MOUTH EVERY DAY           Past Medical History:  Diagnosis Date  . Goiter     Past Surgical History:  Procedure Laterality Date  . PELVIC LAPAROSCOPY  2003   ectopic pregnancy    Family History  Problem Relation Age of Onset  . Thyroid disease Neg Hx     Social History:  reports that she has been smoking. She has never used smokeless tobacco. She reports current alcohol use. No history on file for drug.  Allergies:  Allergies  Allergen Reactions  . 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

## 2018-05-17 ENCOUNTER — Encounter: Payer: Self-pay | Admitting: Nurse Practitioner

## 2018-05-17 ENCOUNTER — Ambulatory Visit (INDEPENDENT_AMBULATORY_CARE_PROVIDER_SITE_OTHER): Payer: PRIVATE HEALTH INSURANCE | Admitting: Nurse Practitioner

## 2018-05-17 ENCOUNTER — Other Ambulatory Visit (HOSPITAL_COMMUNITY)
Admission: RE | Admit: 2018-05-17 | Discharge: 2018-05-17 | Disposition: A | Discharge status: Home / Self Care | Payer: PRIVATE HEALTH INSURANCE | Source: Ambulatory Visit | Attending: Nurse Practitioner | Admitting: Nurse Practitioner

## 2018-05-17 VITALS — BP 100/66 | HR 84 | Temp 98.7°F | Ht 63.0 in | Wt 147.0 lb

## 2018-05-17 DIAGNOSIS — N898 Other specified noninflammatory disorders of vagina: Secondary | ICD-10-CM | POA: Diagnosis present

## 2018-05-17 DIAGNOSIS — Z136 Encounter for screening for cardiovascular disorders: Secondary | ICD-10-CM | POA: Diagnosis not present

## 2018-05-17 DIAGNOSIS — Z0001 Encounter for general adult medical examination with abnormal findings: Secondary | ICD-10-CM

## 2018-05-17 DIAGNOSIS — Z5320 Procedure and treatment not carried out because of patient's decision for unspecified reasons: Secondary | ICD-10-CM

## 2018-05-17 DIAGNOSIS — B353 Tinea pedis: Secondary | ICD-10-CM | POA: Diagnosis not present

## 2018-05-17 DIAGNOSIS — Z13220 Encounter for screening for lipoid disorders: Secondary | ICD-10-CM | POA: Diagnosis not present

## 2018-05-17 DIAGNOSIS — D500 Iron deficiency anemia secondary to blood loss (chronic): Secondary | ICD-10-CM

## 2018-05-17 MED ORDER — MICONAZOLE NITRATE 2 % EX POWD: Freq: Two times a day (BID) | CUTANEOUS | 1 refills | Status: AC

## 2018-05-17 MED ORDER — KETOCONAZOLE 2 % EX CREA: 1.0000 "application " | TOPICAL_CREAM | Freq: Every day | CUTANEOUS | 0 refills | Status: DC

## 2018-05-17 MED ORDER — TERBINAFINE HCL 250 MG PO TABS: 250.0000 mg | ORAL_TABLET | Freq: Once | ORAL | 0 refills | Status: AC

## 2018-05-17 NOTE — Progress Notes (Signed)
Subjective:    Patient ID: Tonya Porter, female    DOB: [birth year], 38 y.o.   MRN: [id number]  Patient presents today for complete physical   Vaginal Discharge  The patient's primary symptoms include a genital odor and vaginal discharge. The patient's pertinent negatives include no genital itching, genital lesions, genital rash, missed menses, pelvic pain or vaginal bleeding. This is a new problem. The current episode started in the past 7 days. The problem occurs constantly. The problem has been unchanged. The patient is experiencing no pain. She is not pregnant. Associated symptoms include rash. Pertinent negatives include no chills, constipation, diarrhea, discolored urine, dysuria, fever, frequency, headaches, joint pain, painful intercourse, sore throat or urgency. The vaginal discharge was thin, milky and white. There has been no bleeding. She has not been passing clots. She has not been passing tissue. She is sexually active. No, her partner does not have an STD. She uses condoms for contraception. Her menstrual history has been regular. Her past medical history is significant for vaginosis. There is no history of PID or an STD.  Rash  This is a chronic problem. The current episode started more than 1 year ago. The problem has been waxing and waning since onset. The affected locations include the left foot. The rash is characterized by itchiness, dryness and scaling. It is unknown if there was an exposure to a precipitant. Pertinent negatives include no congestion, cough, diarrhea, fatigue, fever, joint pain, nail changes, shortness of breath or sore throat. Past treatments include anti-itch cream and moisturizer. The treatment provided mild relief. There is no history of allergies or eczema.   Hx of recurrent BV.  Left heel scaly rash, no itching, ongoing for several years. Some improvement with antifungal cream and apple cider vinegar.  Sexual History (orientation,birth control, marital  status, STD):GYN appt in March: Dr. Seymour Bars [facility], OB/[facility]N), last pap 2017: normal per patient.  Depression/Suicide: Depression screen [facility] 2/9 05/17/2018  Decreased Interest 0  Down, Depressed, Hopeless 0  PHQ - 2 Score 0   Vision: will schedule.  Dental: up coming appt.  Immunizations: (TDAP, Hep C screen, Pneumovax, Influenza, zoster)  Health Maintenance  Topic Date Due  . Tetanus Vaccine  03/04/1999  . Flu Shot  07/25/2018*  . Pap Smear  05/21/2019  . HIV Screening  Completed  *Topic was postponed. The date shown is not the original due date.   Diet:regular.  Weight:  Wt Readings from Last 3 Encounters:  05/17/18 147 lb (66.7 kg)  05/12/18 146 lb 6.4 oz (66.4 kg)  10/20/17 152 lb (68.9 kg)   Exercise:none  Fall Risk: Fall Risk  05/17/2018  Falls in the past year? 0    Medications and allergies reviewed with patient and updated if appropriate.  Patient Active Problem List   Diagnosis Date Noted  . Iron deficiency anemia due to chronic blood loss 05/21/2018  . Tinea pedis of left foot 05/21/2018  . Hypothyroidism following radioiodine therapy 03/23/2017  . Multinodular goiter 06/28/2016    Current Outpatient Medications on File Prior to Visit  Medication Sig Dispense Refill  . levothyroxine (SYNTHROID, LEVOTHROID) 75 MCG tablet TAKE 1 TABLET BY MOUTH EVERY DAY 15 tablet 0  . loratadine (ALLERGY) 10 MG tablet 10 mg table as needed for allgeries     No current facility-administered medications on file prior to visit.     Past Medical History:  Diagnosis Date  . Goiter     Past Surgical History:  Procedure Laterality Date  .  EYE SURGERY Right 2002  . PELVIC LAPAROSCOPY  2003   ectopic pregnancy    Social History   Socioeconomic History  . Marital status: Single    Spouse name: Not on file  . Number of children: 0  . Years of education: Not on file  . Highest education level: Not on file  Occupational History  . Not on file  Social Needs  .  Financial resource strain: Not on file  . Food insecurity:    Worry: Not on file    Inability: Not on file  . Transportation needs:    Medical: Not on file    Non-medical: Not on file  Tobacco Use  . Smoking status: Former Smoker    Years: 2.00    Types: Cigarettes    Last attempt to quit: 03/26/2018    Years since quitting: 0.1  . Smokeless tobacco: Never Used  Substance and Sexual Activity  . Alcohol use: Not Currently  . Drug use: Never  . Sexual activity: Yes    Partners: Male    Comment: 1st intercourse- 11, partners- 6, current partner- 5 yrs   Lifestyle  . Physical activity:    Days per week: Not on file    Minutes per session: Not on file  . Stress: Not on file  Relationships  . Social connections:    Talks on phone: Not on file    Gets together: Not on file    Attends religious service: Not on file    Active member of club or organization: Not on file    Attends meetings of clubs or organizations: Not on file    Relationship status: Not on file  Other Topics Concern  . Not on file  Social History Narrative  . Not on file    Family History  Problem Relation Age of Onset  . Alcohol abuse Father   . Heart disease Paternal Grandfather   . Thyroid disease Neg Hx         Review of Systems  Constitutional: Negative for chills, fatigue, fever, malaise/fatigue and weight loss.  HENT: Negative for congestion and sore throat.   Eyes:       Negative for visual changes  Respiratory: Negative for cough and shortness of breath.   Cardiovascular: Negative for chest pain, palpitations and leg swelling.  Gastrointestinal: Negative for blood in stool, constipation, diarrhea and heartburn.  Genitourinary: Positive for vaginal discharge. Negative for dysuria, frequency, missed menses, pelvic pain and urgency.  Musculoskeletal: Negative for falls, joint pain and myalgias.  Skin: Positive for itching and rash. Negative for nail changes.  Neurological: Negative for  dizziness, sensory change and headaches.  Endo/Heme/Allergies: Does not bruise/bleed easily.  Psychiatric/Behavioral: Negative for depression, substance abuse and suicidal ideas. The patient is not nervous/anxious and does not have insomnia.     Objective:   Vitals:   05/17/18 1340  BP: 100/66  Pulse: 84  Temp: 98.7 F (37.1 C)  SpO2: 98%    Body mass index is 26.04 kg/m.   Physical Examination:  Physical Exam Exam conducted with a chaperone present.  HENT:     Right Ear: Tympanic membrane, ear canal and external ear normal.     Left Ear: Tympanic membrane, ear canal and external ear normal.     Nose: Nose normal.     Mouth/Throat:     Mouth: Mucous membranes are dry.     Pharynx: Oropharynx is clear.  Eyes:     Extraocular Movements: Extraocular movements  intact.     Conjunctiva/sclera: Conjunctivae normal.     Pupils: Pupils are equal, round, and reactive to light.  Cardiovascular:     Rate and Rhythm: Normal rate and regular rhythm.     Pulses: Normal pulses.     Heart sounds: Normal heart sounds.  Pulmonary:     Effort: Pulmonary effort is normal.     Breath sounds: Normal breath sounds.  Abdominal:     General: Abdomen is flat. Bowel sounds are normal.     Palpations: Abdomen is soft.  Genitourinary:    General: Normal vulva.     Labia:        Right: No rash or tenderness.        Left: No rash or tenderness.      Vagina: Vaginal discharge present. No erythema.     Cervix: Discharge present. No cervical motion tenderness or friability.     Adnexa:        Right: No tenderness.         Left: No tenderness.    Musculoskeletal:       Feet:  Feet:     Right foot:     Skin integrity: No ulcer, skin breakdown or dry skin.     Toenail Condition: Right toenails are normal.     Left foot:     Skin integrity: Dry skin present. No ulcer or skin breakdown.     Toenail Condition: Left toenails are normal.  Skin:    Findings: Rash present.  Neurological:      Mental Status: She is alert and oriented to person, place, and time.  Psychiatric:        Mood and Affect: Mood normal.        Behavior: Behavior normal.        Thought Content: Thought content normal.     ASSESSMENT and PLAN:  Alvy BealLakisha was seen today for establish care.  Diagnoses and all orders for this visit:  Encounter for preventative adult health care exam with abnormal findings -     HIV Antibody (routine testing w rflx) -     Lipid panel -     Comprehensive metabolic panel -     CBC  Vaginal discharge -     Cervicovaginal ancillary only( [place])  Encounter for lipid screening for cardiovascular disease -     Lipid panel  HIV screening declined -     HIV Antibody (routine testing w rflx)  Tinea pedis of left foot -     Discontinue: ketoconazole (NIZORAL) 2 % cream; Apply 1 application topically daily. -     terbinafine (LAMISIL) 250 MG tablet; Take 1 tablet (250 mg total) by mouth once for 1 dose. -     miconazole (MICOTIN) 2 % powder; Apply topically 2 (two) times daily.  Iron deficiency anemia due to chronic blood loss   No problem-specific Assessment & Plan notes found for this encounter.     Problem List Items Addressed This Visit      Musculoskeletal and Integument   Tinea pedis of left foot   Relevant Medications   miconazole (MICOTIN) 2 % powder     Other   Iron deficiency anemia due to chronic blood loss    Other Visit Diagnoses    Encounter for preventative adult health care exam with abnormal findings    -  Primary   Relevant Orders   HIV Antibody (routine testing w rflx) (Completed)   Lipid panel (Completed)  Comprehensive metabolic panel (Completed)   CBC (Completed)   Vaginal discharge       Relevant Orders   Cervicovaginal ancillary only( [place]) (Completed)   Encounter for lipid screening for cardiovascular disease       Relevant Orders   Lipid panel (Completed)   HIV screening declined       Relevant Orders   HIV  Antibody (routine testing w rflx) (Completed)       Follow up: Return if symptoms worsen or fail to improve.  Alysia Penna, NP

## 2018-05-17 NOTE — Patient Instructions (Addendum)
Abnormal lipid panel with elevated total cholesterol and LDL. These numbers can improve with heart healthy diet and regular exercise. Normal CMP and PAP negative HIV. CBC indicates anemia. Need to take ferrous sulfate '325mg'$  OTC oral 1tab BID with food.  Do not take lamisil tab till you are contacted with lab results.  You will be contacted to lab results.  Health Maintenance, Female Adopting a healthy lifestyle and getting preventive care can go a long way to promote health and wellness. Talk with your health care provider about what schedule of regular examinations is right for you. This is a good chance for you to check in with your provider about disease prevention and staying healthy. In between checkups, there are plenty of things you can do on your own. Experts have done a lot of research about which lifestyle changes and preventive measures are most likely to keep you healthy. Ask your health care provider for more information. Weight and diet Eat a healthy diet  Be sure to include plenty of vegetables, fruits, low-fat dairy products, and lean protein.  Do not eat a lot of foods high in solid fats, added sugars, or salt.  Get regular exercise. This is one of the most important things you can do for your health. ? Most adults should exercise for at least 150 minutes each week. The exercise should increase your heart rate and make you sweat (moderate-intensity exercise). ? Most adults should also do strengthening exercises at least twice a week. This is in addition to the moderate-intensity exercise. Maintain a healthy weight  Body mass index (BMI) is a measurement that can be used to identify possible weight problems. It estimates body fat based on height and weight. Your health care provider can help determine your BMI and help you achieve or maintain a healthy weight.  For females 1 years of age and older: ? A BMI below 18.5 is considered underweight. ? A BMI of 18.5 to 24.9 is  normal. ? A BMI of 25 to 29.9 is considered overweight. ? A BMI of 30 and above is considered obese. Watch levels of cholesterol and blood lipids  You should start having your blood tested for lipids and cholesterol at 39 years of age, then have this test every 5 years.  You may need to have your cholesterol levels checked more often if: ? Your lipid or cholesterol levels are high. ? You are older than 39 years of age. ? You are at high risk for heart disease. Cancer screening Lung Cancer  Lung cancer screening is recommended for adults 4-75 years old who are at high risk for lung cancer because of a history of smoking.  A yearly low-dose CT scan of the lungs is recommended for people who: ? Currently smoke. ? Have quit within the past 15 years. ? Have at least a 30-pack-year history of smoking. A pack year is smoking an average of one pack of cigarettes a day for 1 year.  Yearly screening should continue until it has been 15 years since you quit.  Yearly screening should stop if you develop a health problem that would prevent you from having lung cancer treatment. Breast Cancer  Practice breast self-awareness. This means understanding how your breasts normally appear and feel.  It also means doing regular breast self-exams. Let your health care provider know about any changes, no matter how small.  If you are in your 20s or 30s, you should have a clinical breast exam (CBE) by a health  care provider every 1-3 years as part of a regular health exam.  If you are 61 or older, have a CBE every year. Also consider having a breast X-ray (mammogram) every year.  If you have a family history of breast cancer, talk to your health care provider about genetic screening.  If you are at high risk for breast cancer, talk to your health care provider about having an MRI and a mammogram every year.  Breast cancer gene (BRCA) assessment is recommended for women who have family members with  BRCA-related cancers. BRCA-related cancers include: ? Breast. ? Ovarian. ? Tubal. ? Peritoneal cancers.  Results of the assessment will determine the need for genetic counseling and BRCA1 and BRCA2 testing. Cervical Cancer Your health care provider may recommend that you be screened regularly for cancer of the pelvic organs (ovaries, uterus, and vagina). This screening involves a pelvic examination, including checking for microscopic changes to the surface of your cervix (Pap test). You may be encouraged to have this screening done every 3 years, beginning at age 56.  For women ages 56-65, health care providers may recommend pelvic exams and Pap testing every 3 years, or they may recommend the Pap and pelvic exam, combined with testing for human papilloma virus (HPV), every 5 years. Some types of HPV increase your risk of cervical cancer. Testing for HPV may also be done on women of any age with unclear Pap test results.  Other health care providers may not recommend any screening for nonpregnant women who are considered low risk for pelvic cancer and who do not have symptoms. Ask your health care provider if a screening pelvic exam is right for you.  If you have had past treatment for cervical cancer or a condition that could lead to cancer, you need Pap tests and screening for cancer for at least 20 years after your treatment. If Pap tests have been discontinued, your risk factors (such as having a new sexual partner) need to be reassessed to determine if screening should resume. Some women have medical problems that increase the chance of getting cervical cancer. In these cases, your health care provider may recommend more frequent screening and Pap tests. Colorectal Cancer  This type of cancer can be detected and often prevented.  Routine colorectal cancer screening usually begins at 39 years of age and continues through 39 years of age.  Your health care provider may recommend screening at  an earlier age if you have risk factors for colon cancer.  Your health care provider may also recommend using home test kits to check for hidden blood in the stool.  A small camera at the end of a tube can be used to examine your colon directly (sigmoidoscopy or colonoscopy). This is done to check for the earliest forms of colorectal cancer.  Routine screening usually begins at age 72.  Direct examination of the colon should be repeated every 5-10 years through 39 years of age. However, you may need to be screened more often if early forms of precancerous polyps or small growths are found. Skin Cancer  Check your skin from head to toe regularly.  Tell your health care provider about any new moles or changes in moles, especially if there is a change in a mole's shape or color.  Also tell your health care provider if you have a mole that is larger than the size of a pencil eraser.  Always use sunscreen. Apply sunscreen liberally and repeatedly throughout the day.  Protect  yourself by wearing long sleeves, pants, a wide-brimmed hat, and sunglasses whenever you are outside. Heart disease, diabetes, and high blood pressure  High blood pressure causes heart disease and increases the risk of stroke. High blood pressure is more likely to develop in: ? People who have blood pressure in the high end of the normal range (130-139/85-89 mm Hg). ? People who are overweight or obese. ? People who are African American.  If you are 14-4 years of age, have your blood pressure checked every 3-5 years. If you are 70 years of age or older, have your blood pressure checked every year. You should have your blood pressure measured twice-once when you are at a hospital or clinic, and once when you are not at a hospital or clinic. Record the average of the two measurements. To check your blood pressure when you are not at a hospital or clinic, you can use: ? An automated blood pressure machine at a pharmacy. ? A  home blood pressure monitor.  If you are between 34 years and 54 years old, ask your health care provider if you should take aspirin to prevent strokes.  Have regular diabetes screenings. This involves taking a blood sample to check your fasting blood sugar level. ? If you are at a normal weight and have a low risk for diabetes, have this test once every three years after 39 years of age. ? If you are overweight and have a high risk for diabetes, consider being tested at a younger age or more often. Preventing infection Hepatitis B  If you have a higher risk for hepatitis B, you should be screened for this virus. You are considered at high risk for hepatitis B if: ? You were born in a country where hepatitis B is common. Ask your health care provider which countries are considered high risk. ? Your parents were born in a high-risk country, and you have not been immunized against hepatitis B (hepatitis B vaccine). ? You have HIV or AIDS. ? You use needles to inject street drugs. ? You live with someone who has hepatitis B. ? You have had sex with someone who has hepatitis B. ? You get hemodialysis treatment. ? You take certain medicines for conditions, including cancer, organ transplantation, and autoimmune conditions. Hepatitis C  Blood testing is recommended for: ? Everyone born from [birth year] through 1966. ? Anyone with known risk factors for hepatitis C. Sexually transmitted infections (STIs)  You should be screened for sexually transmitted infections (STIs) including gonorrhea and chlamydia if: ? You are sexually active and are younger than 39 years of age. ? You are older than 39 years of age and your health care provider tells you that you are at risk for this type of infection. ? Your sexual activity has changed since you were last screened and you are at an increased risk for chlamydia or gonorrhea. Ask your health care provider if you are at risk.  If you do not have HIV, but are  at risk, it may be recommended that you take a prescription medicine daily to prevent HIV infection. This is called pre-exposure prophylaxis (PrEP). You are considered at risk if: ? You are sexually active and do not regularly use condoms or know the HIV status of your partner(s). ? You take drugs by injection. ? You are sexually active with a partner who has HIV. Talk with your health care provider about whether you are at high risk of being infected with HIV. If  you choose to begin PrEP, you should first be tested for HIV. You should then be tested every 3 months for as long as you are taking PrEP. Pregnancy  If you are premenopausal and you may become pregnant, ask your health care provider about preconception counseling.  If you may become pregnant, take 400 to 800 micrograms (mcg) of folic acid every day.  If you want to prevent pregnancy, talk to your health care provider about birth control (contraception). Osteoporosis and menopause  Osteoporosis is a disease in which the bones lose minerals and strength with aging. This can result in serious bone fractures. Your risk for osteoporosis can be identified using a bone density scan.  If you are 57 years of age or older, or if you are at risk for osteoporosis and fractures, ask your health care provider if you should be screened.  Ask your health care provider whether you should take a calcium or vitamin D supplement to lower your risk for osteoporosis.  Menopause may have certain physical symptoms and risks.  Hormone replacement therapy may reduce some of these symptoms and risks. Talk to your health care provider about whether hormone replacement therapy is right for you. Follow these instructions at home:  Schedule regular health, dental, and eye exams.  Stay current with your immunizations.  Do not use any tobacco products including cigarettes, chewing tobacco, or electronic cigarettes.  If you are pregnant, do not drink  alcohol.  If you are breastfeeding, limit how much and how often you drink alcohol.  Limit alcohol intake to no more than 1 drink per day for nonpregnant women. One drink equals 12 ounces of beer, 5 ounces of wine, or 1 ounces of hard liquor.  Do not use street drugs.  Do not share needles.  Ask your health care provider for help if you need support or information about quitting drugs.  Tell your health care provider if you often feel depressed.  Tell your health care provider if you have ever been abused or do not feel safe at home. This information is not intended to replace advice given to you by your health care provider. Make sure you discuss any questions you have with your health care provider. Document Released: 10/26/2010 Document Revised: 09/18/2015 Document Reviewed: 01/14/2015 Elsevier Interactive Patient Education  2019 [employer]  Athlete's foot (tinea pedis) is a fungal infection of the skin on your feet. It often occurs on the skin that is between or underneath your toes. It can also occur on the soles of your feet. Symptoms include itchy or white and flaky areas on the skin. The infection can spread from person to person (is contagious). It can also spread when a person's bare feet come in contact with the fungus on shower floors or on items such as shoes. Follow these instructions at home: Medicines  Apply or take over-the-counter and prescription medicines only as told by your doctor.  Apply your antifungal medicine as told by your doctor. Do not stop using the medicine even if your feet start to get better. Foot care  Do not scratch your feet.  Keep your feet dry: ? Wear cotton or wool socks. Change your socks every day or if they become wet. ? Wear shoes that allow air to move around, such as sandals or canvas tennis shoes.  Wash and dry your feet: ? Every day or as told by your doctor. ? After exercising. ? Including the area between  your  toes. General instructions  Do not share any of these items that touch your feet: ? Towels. ? Shoes. ? Nail clippers. ? Other personal items.  Protect your feet by wearing sandals in wet areas, such as locker rooms and shared showers.  Keep all follow-up visits as told by your doctor. This is important.  If you have diabetes, keep your blood sugar under control. Contact a doctor if:  You have a fever.  You have swelling, pain, warmth, or redness in your foot.  Your feet are not getting better with treatment.  Your symptoms get worse.  You have new symptoms. Summary  Athlete's foot is a fungal infection of the skin on your feet.  Symptoms include itchy or white and flaky areas on the skin.  Apply your antifungal medicine as told by your doctor.  Keep your feet clean and dry. This information is not intended to replace advice given to you by your health care provider. Make sure you discuss any questions you have with your health care provider. Document Released: 09/29/2007 Document Revised: 01/31/2017 Document Reviewed: 01/31/2017 Elsevier Interactive Patient Education  2019 [employer].

## 2018-05-18 LAB — COMPREHENSIVE METABOLIC PANEL
ALK PHOS: 46 U/L (ref 39–117)
ALT: 8 U/L (ref 0–35)
AST: 11 U/L (ref 0–37)
Albumin: 4.1 g/dL (ref 3.5–5.2)
BUN: 11 mg/dL (ref 6–23)
CHLORIDE: 108 meq/L (ref 96–112)
CO2: 23 meq/L (ref 19–32)
Calcium: 8.9 mg/dL (ref 8.4–10.5)
Creatinine, Ser: 0.83 mg/dL (ref 0.40–1.20)
GFR: 92.99 mL/min (ref >60.00)
GLUCOSE: 87 mg/dL (ref 70–99)
POTASSIUM: 3.7 meq/L (ref 3.5–5.1)
Sodium: 138 mEq/L (ref 135–145)
Total Bilirubin: 0.3 mg/dL (ref 0.2–1.2)
Total Protein: 6.9 g/dL (ref 6.0–8.3)

## 2018-05-18 LAB — CBC
HEMATOCRIT: 30.4 % — AB (ref 36.0–46.0)
HEMOGLOBIN: 9.7 g/dL — AB (ref 12.0–15.0)
MCHC: 31.7 g/dL (ref 30.0–36.0)
MCV: 73.9 fl — AB (ref 78.0–100.0)
PLATELETS: 211 10*3/uL (ref 150.0–400.0)
RBC: 4.12 Mil/uL (ref 3.87–5.11)
RDW: 17.4 % — ABNORMAL HIGH (ref 11.5–15.5)
WBC: 5.1 10*3/uL (ref 4.0–10.5)

## 2018-05-18 LAB — LIPID PANEL
CHOL/HDL RATIO: 4
Cholesterol: 206 mg/dL — ABNORMAL HIGH (ref 0–200)
HDL: 52.4 mg/dL (ref >39.00)
LDL CALC: 141 mg/dL — AB (ref 0–99)
NONHDL: 153.24
Triglycerides: 63 mg/dL (ref 0.0–149.0)
VLDL: 12.6 mg/dL (ref 0.0–40.0)

## 2018-05-18 LAB — HIV ANTIBODY (ROUTINE TESTING W REFLEX): HIV 1&2 Ab, 4th Generation: NONREACTIVE

## 2018-05-18 LAB — CERVICOVAGINAL ANCILLARY ONLY
Bacterial vaginitis: NEGATIVE
Candida vaginitis: NEGATIVE
Chlamydia: NEGATIVE
Neisseria Gonorrhea: NEGATIVE
Trichomonas: NEGATIVE

## 2018-05-21 DIAGNOSIS — B353 Tinea pedis: Secondary | ICD-10-CM | POA: Insufficient documentation

## 2018-05-21 DIAGNOSIS — D500 Iron deficiency anemia secondary to blood loss (chronic): Secondary | ICD-10-CM | POA: Insufficient documentation

## 2018-05-25 ENCOUNTER — Other Ambulatory Visit: Payer: Self-pay | Admitting: Endocrinology

## 2018-06-14 ENCOUNTER — Other Ambulatory Visit: Payer: Self-pay | Admitting: Nurse Practitioner

## 2018-06-14 ENCOUNTER — Other Ambulatory Visit: Payer: Self-pay | Admitting: Endocrinology

## 2018-06-14 DIAGNOSIS — B353 Tinea pedis: Secondary | ICD-10-CM

## 2018-06-21 ENCOUNTER — Telehealth: Payer: Self-pay | Admitting: Nurse Practitioner

## 2018-06-21 NOTE — Telephone Encounter (Signed)
Error

## 2018-06-26 ENCOUNTER — Ambulatory Visit: Payer: PRIVATE HEALTH INSURANCE | Admitting: Nurse Practitioner

## 2018-06-27 ENCOUNTER — Encounter: Payer: Self-pay | Admitting: Obstetrics & Gynecology

## 2018-07-03 ENCOUNTER — Ambulatory Visit: Payer: PRIVATE HEALTH INSURANCE | Admitting: Nurse Practitioner

## 2018-07-04 ENCOUNTER — Other Ambulatory Visit: Payer: Self-pay | Admitting: Endocrinology

## 2018-07-17 ENCOUNTER — Ambulatory Visit: Payer: PRIVATE HEALTH INSURANCE | Admitting: Nurse Practitioner

## 2018-08-14 ENCOUNTER — Ambulatory Visit: Payer: PRIVATE HEALTH INSURANCE | Admitting: Nurse Practitioner

## 2018-08-14 ENCOUNTER — Encounter: Payer: Self-pay | Admitting: Obstetrics & Gynecology

## 2018-08-28 ENCOUNTER — Encounter: Payer: Self-pay | Admitting: Obstetrics & Gynecology

## 2018-08-29 ENCOUNTER — Ambulatory Visit: Payer: PRIVATE HEALTH INSURANCE | Admitting: Nurse Practitioner

## 2018-09-27 ENCOUNTER — Other Ambulatory Visit: Payer: Self-pay | Admitting: Endocrinology

## 2019-03-27 IMAGING — US US OB TRANSVAGINAL
1 series · 13 of 28 positions shown · non-contrast
Comparison: None for this gestation

CLINICAL DATA: Vaginal bleeding for 3 weeks, pregnant, LMP
09/21/2017

EXAM:
OBSTETRIC <14 WK US AND TRANSVAGINAL OB US
TECHNIQUE: Both transabdominal and transvaginal ultrasound examinations were
performed for complete evaluation of the gestation as well as the
maternal uterus, adnexal regions, and pelvic cul-de-sac.
Transvaginal technique was performed to assess early pregnancy.

[Series 1: us ob transvaginal · 0.26mm/px · 116 acquisitions, 13 frames shown]
[im 5/116]
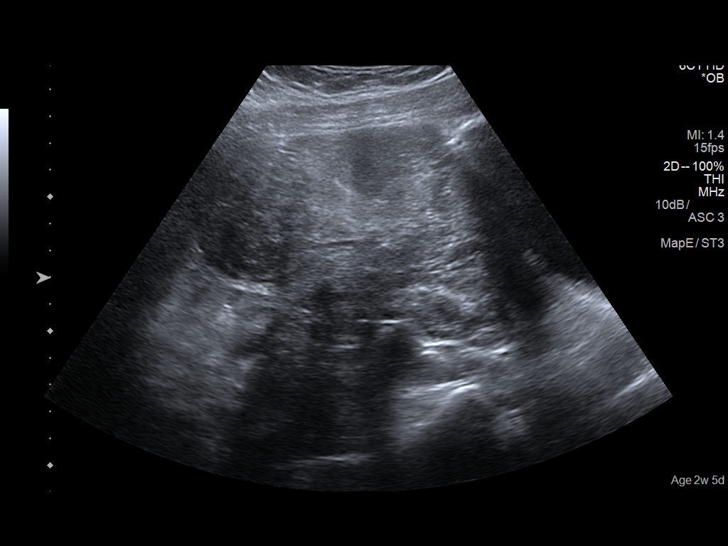
[im 13/116]
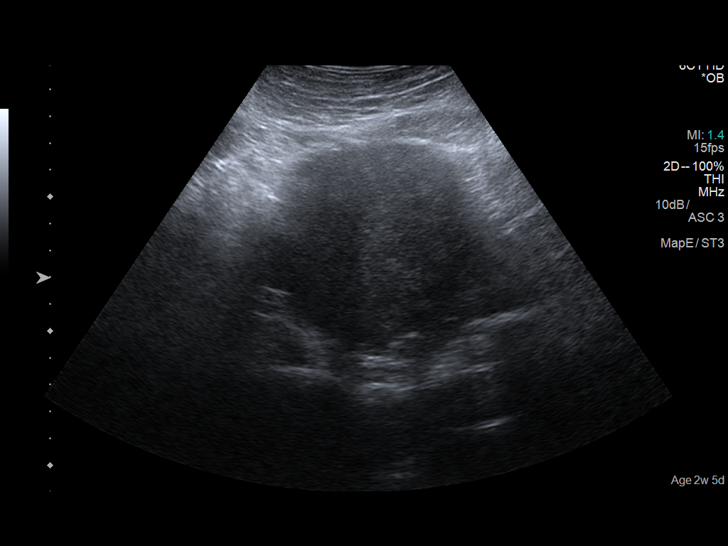
[im 22/116]
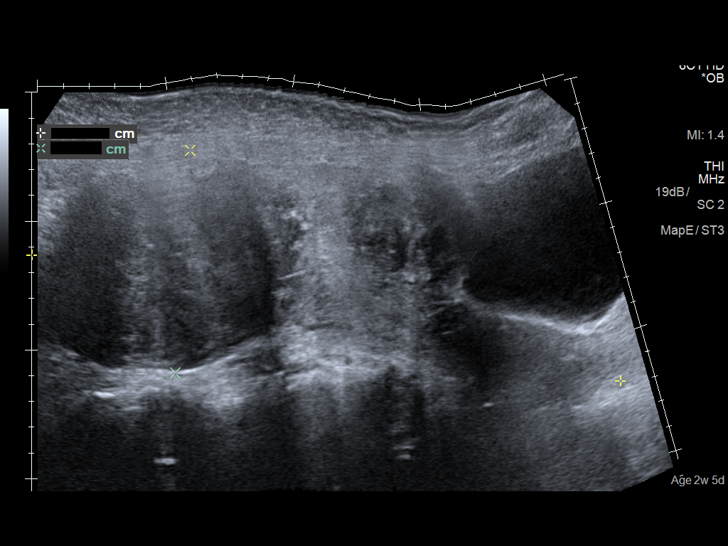
[im 30/116]
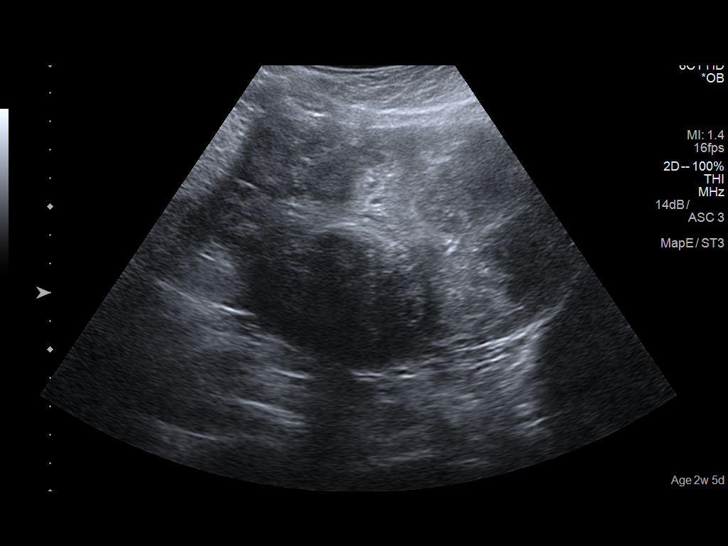
[im 39/116]
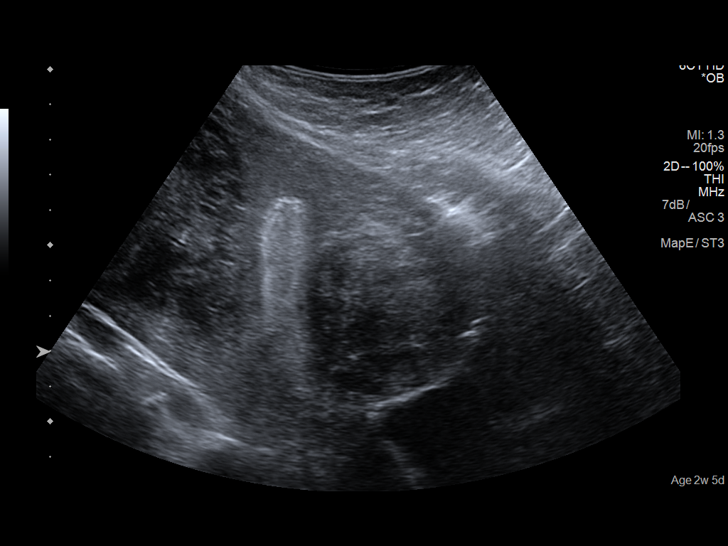
[im 47/116]
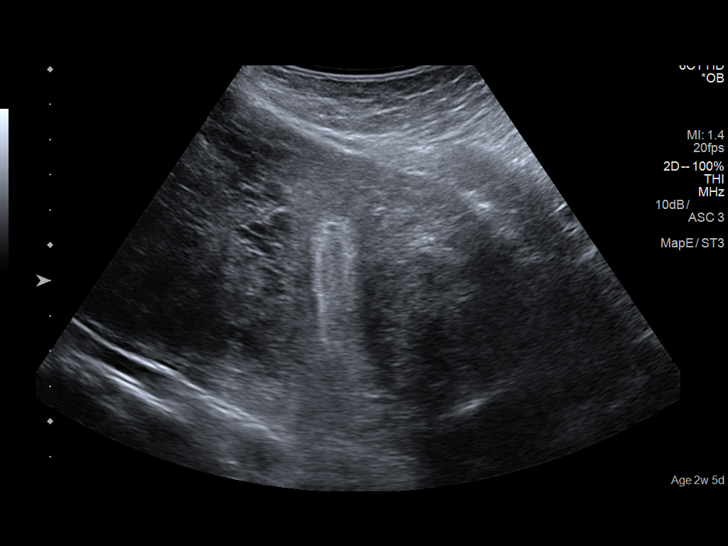
[im 60/116]
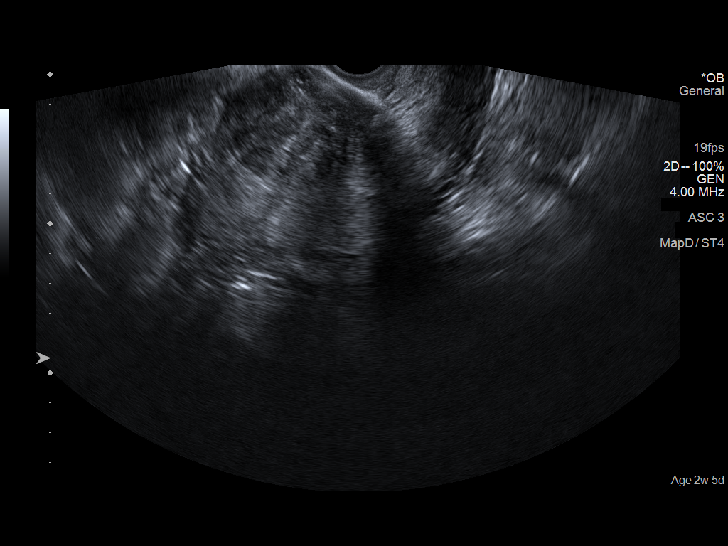
[im 69/116]
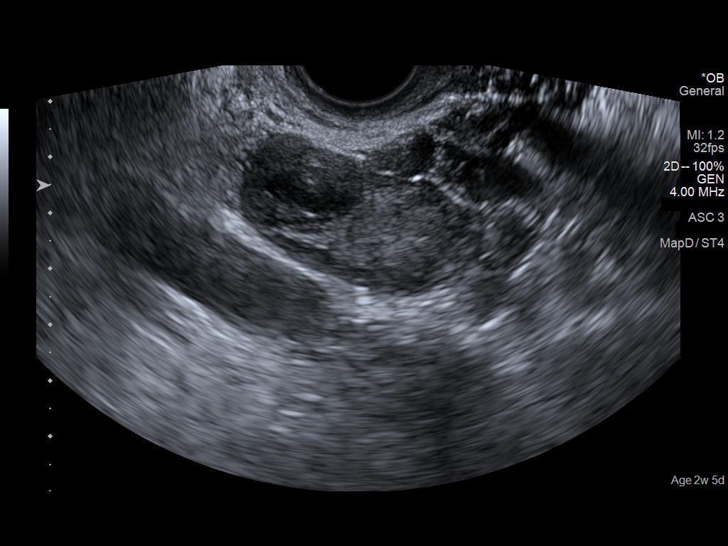
[im 77/116]
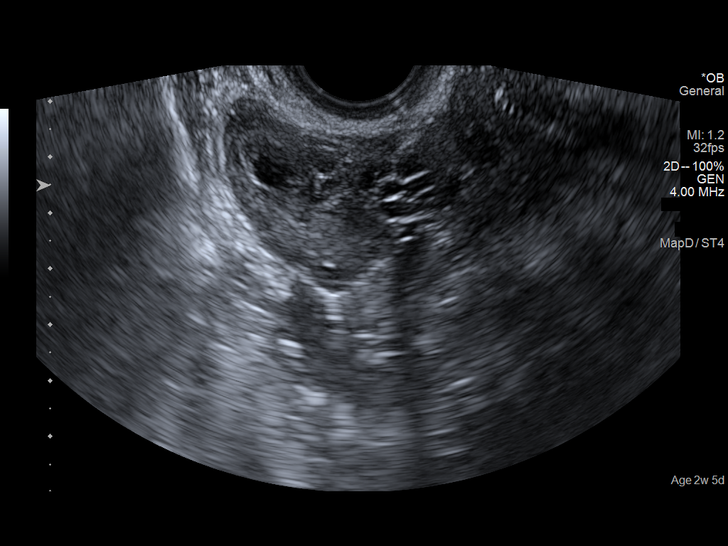
[im 86/116]
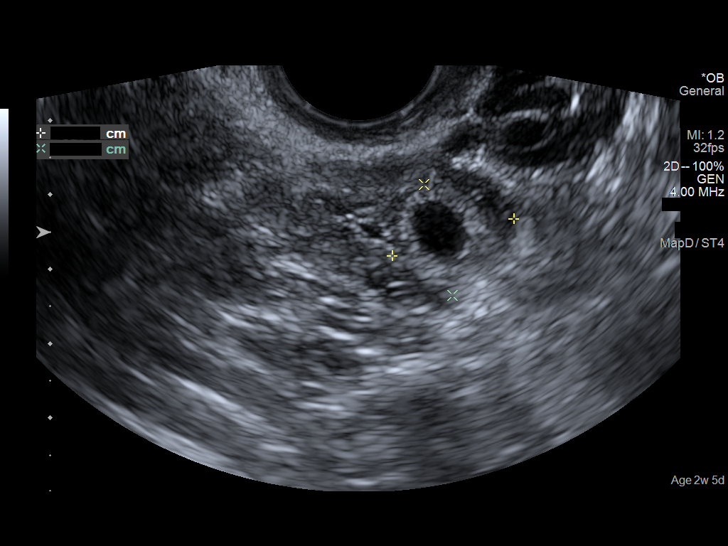
[im 94/116]
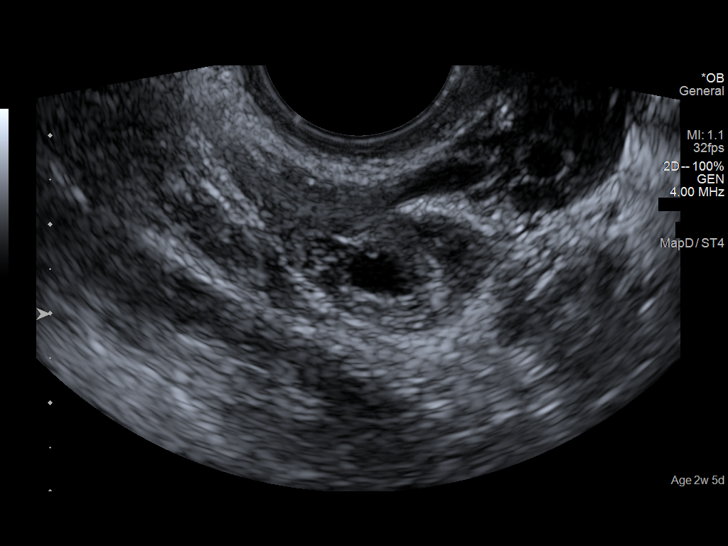
[im 103/116]
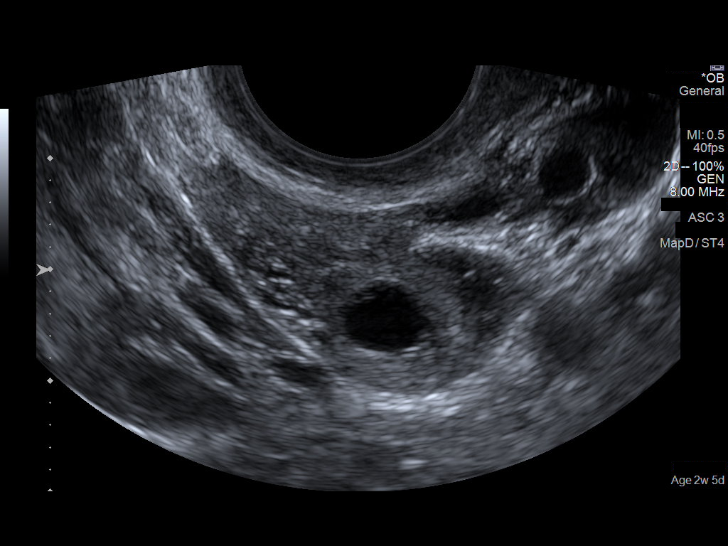
[im 111/116]
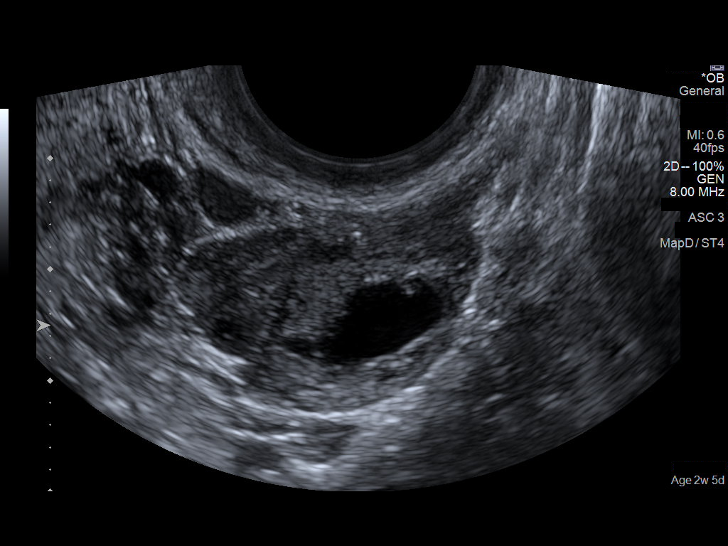

[13 of 28 positions shown; findings below may reference images not displayed]

FINDINGS: Intrauterine gestational sac: Absent

Yolk sac:  N/A

Embryo:  N/A

Cardiac Activity: N/A

Heart Rate: N/A  bpm

MSD:   mm    w     d

CRL:    mm    w    d                  US EDC:

Subchorionic hemorrhage:  N/A

Maternal uterus/adnexae:

Uterus is enlarged at 23.3 x 8.7 x 15.9 cm.

Uterus appears heterogeneous with multiple nodules consistent with
multiple leiomyomata.

These include large leiomyomata measuring up to 11.8 x 8.2 x
cm, 7.0 x 4.9 x 6.4 cm, 5.8 x 4.1 x 5.2 cm, and 4.8 x 5.5 x 5.4 cm.

No gestational sac or endometrial fluid collection.

Endometrial complex measures up to 14 mm thick, distorted by
leiomyomata.

RIGHT ovary measures 4.3 x 2.2 x 2.5 cm contains a small hemorrhagic
corpus luteum.

LEFT ovary normal size morphology, 4.2 x 2.2 x 2.6 cm.

Blood flow present within both ovaries on color Doppler imaging.

No endometrial fluid or adnexal masses.
IMPRESSION: No intrauterine gestational sac identified.

Findings are compatible with pregnancy of unknown location.

Differential diagnosis includes early intrauterine pregnancy too
early to visualize, spontaneous abortion, and ectopic pregnancy.

Serial quantitative beta hCG and or followup ultrasound recommended
to definitively exclude ectopic pregnancy.

Enlarged uterus containing multiple large uterine leiomyomata as
above.

## 2019-04-04 ENCOUNTER — Telehealth: Payer: PRIVATE HEALTH INSURANCE

## 2019-04-05 ENCOUNTER — Telehealth: Payer: Self-pay

## 2019-04-24 ENCOUNTER — Telehealth: Payer: Self-pay

## 2019-04-24 ENCOUNTER — Other Ambulatory Visit: Payer: Self-pay

## 2019-04-24 MED ORDER — LEVOTHYROXINE SODIUM 75 MCG PO TABS: 75.0000 ug | ORAL_TABLET | Freq: Every day | ORAL | 0 refills | Status: AC

## 2019-04-24 NOTE — Telephone Encounter (Signed)
Rx sent 

## 2019-04-24 NOTE — Telephone Encounter (Signed)
MEDICATION: levothyroxine (SYNTHROID) 75 MCG tablet  PHARMACY:  [id number] - [place], [place] - [address] AT [address]  IS THIS A 90 DAY SUPPLY : yes   IS PATIENT OUT OF MEDICATION:   IF NOT; HOW MUCH IS LEFT:   LAST APPOINTMENT DATE: @Visit  date not found  NEXT APPOINTMENT DATE:@1 /18/2021  DO WE HAVE YOUR PERMISSION TO LEAVE A DETAILED MESSAGE:  OTHER COMMENTS:    **Let patient know to contact pharmacy at the end of the day to make sure medication is ready. **  ** Please notify patient to allow 48-72 hours to process**  **Encourage patient to contact the pharmacy for refills or they can request refills through [facility]**

## 2019-05-14 ENCOUNTER — Other Ambulatory Visit: Payer: PRIVATE HEALTH INSURANCE

## 2019-05-17 ENCOUNTER — Ambulatory Visit: Payer: PRIVATE HEALTH INSURANCE | Admitting: Endocrinology

## 2019-05-17 NOTE — Progress Notes (Deleted)
Patient ID: Tonya Porter, female   DOB: 1980/09/16, 39 y.o.   MRN: [id number]                                                                                                                Reason for Appointment:  follow-up of thyroid    History of Present Illness:   Background information on initial consultation:  For the last  patient 2-3 years she has had increased warmth and sweating; she is sometimes sweating excessively at night and wetting her clothes Occasionally also she will have a feeling of the heart palpitating. She tends to get shaky sometimes when she is doing some activities For the last year or so she has been waking up feeling tired and has increased fatigability also  She had been seen by her PCP in 1/18 for a general physical examination was also complaining of the right side of her neck swelling more than before She is apparently had thyroid nodules since about 2010 She was evaluated with ultrasound and needle aspiration biopsies at that time which were benign However she has not followed up for this problem subsequently She does not complain of any difficulty swallowing but occasionally may feel a little pressure in her neck, no difficulty breathing  Report of ultrasound: See end of the note Needle aspiration biopsy done of the nodule in the Right Mid lobe near isthmus showed benign follicular nodule Prior biopsy reports from 3154: [facility]. SEE COMMENT. Comment: The differential diagnosis includes a partially cystic adenomatousnodule and a hyperplastic lesion. 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 she was evaluated with a thyroid scan which showed hot nodules on the right side She was treated with 29 mCi of I-131 on 08/12/16  She developed mild hypothyroidism in 7/18 She was however having only minimal symptoms of fatigue or cold intolerance, had only gained 3 pounds  She has been on 75 g of levothyroxine  since 7/18 She is taking this consistently before eating in the morning  No recent complains of fatigue or weight change Menstrual cycles are regular now   Her TSH is now  consistently normal although still below 1.0    Wt Readings from Last 3 Encounters:  05/17/18 147 lb (66.7 kg)  05/12/18 146 lb 6.4 oz (66.4 kg)  10/20/17 152 lb (68.9 kg)     Thyroid function tests as follows:    Baseline Free T3 on 05/20/16 was 4.05, normal <3.90   Lab Results  Component Value Date   TSH 0.86 05/11/2018   TSH 0.55 09/28/2017   TSH 0.72 03/21/2017   FREET4 0.78 05/11/2018   FREET4 1.10 09/28/2017   FREET4 0.84 03/21/2017      No results found for: THYROTRECAB   Allergies as of 05/17/2019      Reactions   Latex Hives      Medication List       Accurate as of May 17, 2019  9:56 AM. If you have any questions, ask your  nurse or doctor.        Allergy 10 MG tablet Generic drug: loratadine 10 mg table as needed for allgeries   levothyroxine 75 MCG tablet Commonly known as: SYNTHROID Take 1 tablet (75 mcg total) by mouth daily.   miconazole 2 % powder Commonly known as: MICOTIN Apply topically 2 (two) times daily.           Past Medical History:  Diagnosis Date  . Goiter     Past Surgical History:  Procedure Laterality Date  . EYE SURGERY Right 2002  . PELVIC LAPAROSCOPY  2003   ectopic pregnancy    Family History  Problem Relation Age of Onset  . Alcohol abuse Father   . Heart disease Paternal Grandfather   . Thyroid disease Neg Hx     Social History:  reports that she quit smoking about 13 months ago. Her smoking use included cigarettes. She quit after 2.00 years of use. She has never used smokeless tobacco. She reports previous alcohol use. She reports that she does not use drugs.  Allergies:  Allergies  Allergen Reactions  . Latex Hives     Review of Systems    History of allergic rhinitis    Examination:   There were no vitals taken  for this visit.  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

## 2020-06-03 ENCOUNTER — Ambulatory Visit: Payer: PRIVATE HEALTH INSURANCE | Admitting: Podiatry

## 2020-06-09 ENCOUNTER — Ambulatory Visit: Payer: 59 | Admitting: Podiatry

## 2020-06-16 ENCOUNTER — Ambulatory Visit: Payer: 59 | Admitting: Podiatry

## 2020-06-24 ENCOUNTER — Other Ambulatory Visit: Payer: Self-pay | Admitting: Endocrinology

## 2020-06-24 ENCOUNTER — Other Ambulatory Visit: Payer: Self-pay

## 2020-06-24 ENCOUNTER — Other Ambulatory Visit (INDEPENDENT_AMBULATORY_CARE_PROVIDER_SITE_OTHER): Payer: 59

## 2020-06-24 DIAGNOSIS — E042 Nontoxic multinodular goiter: Secondary | ICD-10-CM

## 2020-06-24 LAB — TSH: TSH: 0.59 u[IU]/mL (ref 0.35–4.50)

## 2020-06-24 LAB — T4, FREE: Free T4: 0.92 ng/dL (ref 0.60–1.60)

## 2020-06-24 LAB — T3, FREE: T3, Free: 2.8 pg/mL (ref 2.3–4.2)

## 2020-06-26 ENCOUNTER — Ambulatory Visit: Payer: 59 | Admitting: Podiatry

## 2020-06-27 ENCOUNTER — Ambulatory Visit: Payer: BLUE CROSS/BLUE SHIELD | Admitting: Endocrinology

## 2020-07-01 ENCOUNTER — Other Ambulatory Visit: Payer: Self-pay

## 2020-07-03 ENCOUNTER — Encounter: Payer: Self-pay | Admitting: Endocrinology

## 2020-07-03 ENCOUNTER — Ambulatory Visit (INDEPENDENT_AMBULATORY_CARE_PROVIDER_SITE_OTHER): Payer: 59 | Admitting: Endocrinology

## 2020-07-03 ENCOUNTER — Other Ambulatory Visit: Payer: Self-pay

## 2020-07-03 VITALS — BP 126/82 | HR 90 | Resp 18 | Ht 63.0 in | Wt 136.8 lb

## 2020-07-03 DIAGNOSIS — E042 Nontoxic multinodular goiter: Secondary | ICD-10-CM

## 2020-07-03 DIAGNOSIS — E890 Postprocedural hypothyroidism: Secondary | ICD-10-CM

## 2020-07-03 NOTE — Progress Notes (Signed)
Patient ID: Tonya Porter, female   DOB: Oct 27, 1980, 40 y.o.   MRN: [id number]                                                                                                                Reason for Appointment:  follow-up of thyroid    History of Present Illness:   Background information on initial consultation:  For the last  patient 2-3 years she has had increased warmth and sweating; she is sometimes sweating excessively at night and wetting her clothes Occasionally also she will have a feeling of the heart palpitating. She tends to get shaky sometimes when she is doing some activities For the last year or so she has been waking up feeling tired and has increased fatigability also  She had been seen by her PCP in 1/18 for a general physical examination was also complaining of the right side of her neck swelling more than before She is apparently had thyroid nodules since about 2010 She was evaluated with ultrasound and needle aspiration biopsies at that time which were benign However she has not followed up for this problem subsequently She does not complain of any difficulty swallowing but occasionally may feel a little pressure in her neck, no difficulty breathing  Needle aspiration biopsy done of the nodule in the Right Mid lobe near isthmus showed benign follicular nodule Prior biopsy reports from 2010: FOLLICULAR LESION. SEE COMMENT. Comment: The differential diagnosis includes a partially cystic adenomatousnodule and a hyperplastic lesion. 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 she was evaluated with a thyroid scan which showed hot nodules on the right side She was treated with 29 mCi of I-131 on 08/12/16  She developed mild hypothyroidism in 10/2016 She was then having only minimal symptoms of fatigue or cold intolerance, had only gained 3 pounds  She has been on 75 g of levothyroxine since 7/18 She has been followed  annually She feels fairly good with her energy level No complaints of shakiness or palpitations She thinks her weight has come down from diet and exercise changes gradually  She was seen in [place] last year and is coming back now after 2 years   Her TSH is again consistently normal although still below 1.0 Free T4 and T3 levels are normal  Wt Readings from Last 3 Encounters:  07/03/20 136 lb 12.8 oz (62.1 kg)  05/17/18 147 lb (66.7 kg)  05/12/18 146 lb 6.4 oz (66.4 kg)     Thyroid function tests as follows:    Baseline Free T3 on 05/20/16 was 4.05, normal <3.90   Lab Results  Component Value Date   TSH 0.59 06/24/2020   TSH 0.86 05/11/2018   TSH 0.55 09/28/2017   FREET4 0.92 06/24/2020   FREET4 0.78 05/11/2018   FREET4 1.10 09/28/2017    Review of her previous ultrasound and biopsy reports and she has had adequate evaluation and consistently normal biopsies indicating continued benign process   No results found for: [facility]  Allergies as of 07/03/2020      Reactions   Latex Hives      Medication List       Accurate as of July 03, 2020  1:07 PM. If you have any questions, ask your nurse or doctor.        levothyroxine 75 MCG tablet Commonly known as: SYNTHROID Take 1 tablet (75 mcg total) by mouth daily.   loratadine 10 MG tablet Commonly known as: CLARITIN 10 mg table as needed for allgeries   miconazole 2 % powder Commonly known as: MICOTIN Apply topically 2 (two) times daily.           Past Medical History:  Diagnosis Date  . Goiter     Past Surgical History:  Procedure Laterality Date  . EYE SURGERY Right 2002  . PELVIC LAPAROSCOPY  2003   ectopic pregnancy    Family History  Problem Relation Age of Onset  . Alcohol abuse Father   . Heart disease Paternal Grandfather   . Thyroid disease Neg Hx     Social History:  reports that she quit smoking about 2 years ago. Her smoking use included cigarettes. She quit after 2.00  years of use. She has never used smokeless tobacco. She reports previous alcohol use. She reports that she does not use drugs.  Allergies:  Allergies  Allergen Reactions  . 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

## 2021-07-07 ENCOUNTER — Other Ambulatory Visit: Payer: Self-pay

## 2021-07-09 ENCOUNTER — Ambulatory Visit: Payer: Self-pay | Admitting: Endocrinology

## 2021-07-09 NOTE — Progress Notes (Deleted)
Patient ID: Tonya Porter, female   DOB: 07/18/1980, 41 y.o.   MRN: [id number]  ? ?                                                                                              ? ?             ? ?Reason for Appointment:  follow-up of thyroid ? ? ? History of Present Illness:  ? ?Background information on initial consultation: ? For the last  patient 2-3 years she has had increased warmth and sweating; she is sometimes sweating excessively at night and wetting her clothes ?Occasionally also she will have a feeling of the heart palpitating. ?She tends to get shaky sometimes when she is doing some activities ?For the last year or so she has been waking up feeling tired and has increased fatigability also ? ?She had been seen by her PCP in 1/18 for a general physical examination was also complaining of the right side of her neck swelling more than before ?She is apparently had thyroid nodules since about 2010 ?She was evaluated with ultrasound and needle aspiration biopsies at that time which were benign ?However she has not followed up for this problem subsequently ?She does not complain of any difficulty swallowing but occasionally may feel a little pressure in her neck, no difficulty breathing ? ?Needle aspiration biopsy done of the nodule in the Right Mid lobe near isthmus showed benign follicular nodule ?Prior biopsy reports from 2010: ?FOLLICULAR LESION. SEE COMMENT. ? Comment: The differential diagnosis includes a partially cystic adenomatous nodule and a hyperplastic lesion. (Jdp:Mj) ? 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 ?She developed mild hypothyroidism in 10/2016 ?She was then having only minimal symptoms of fatigue or cold intolerance, had only gained 3 pounds ? ?She has been on 75 ?g of levothyroxine since 7/18 ?She has been followed  annually ?She feels fairly good with her energy level ?No complaints of shakiness or palpitations ?She thinks her weight has come down from diet and exercise changes gradually ? ?She was seen in [place] last year and is coming back now after 2 years ? ? ?Her TSH is again consistently normal although still below 1.0 ?Free T4 and T3 levels are normal ? ?Wt Readings from Last 3 Encounters:  ?07/03/20 136 lb 12.8 oz (62.1 kg)  ?05/17/18 147 lb (66.7 kg)  ?05/12/18 146 lb 6.4 oz (66.4 kg)  ? ?  ?Thyroid function tests as follows:   ? ?Baseline Free T3 on 05/20/16 was 4.05, normal <3.90 ?  ?Lab Results  ?Component Value Date  ? TSH 0.59 06/24/2020  ? TSH 0.86 05/11/2018  ? TSH 0.55 09/28/2017  ? FREET4 0.92 06/24/2020  ? FREET4 0.78 05/11/2018  ? FREET4 1.10 09/28/2017  ? ? ?Review of her previous ultrasound and biopsy reports and she has had adequate evaluation and consistently normal biopsies indicating continued benign process ? ? ?No results found for:  THYROTRECAB ? ? ?Allergies as of 07/09/2021   ? ?   Reactions  ? Latex Hives  ? ?  ? ?  ?Medication List  ?  ? ?  ? Accurate as of July 09, 2021 12:14 PM. If you have any questions, ask your nurse or doctor.  ?  ?  ? ?  ? ?levothyroxine 75 MCG tablet ?Commonly known as: SYNTHROID ?Take 1 tablet (75 mcg total) by mouth daily. ?  ?miconazole 2 % powder ?Commonly known as: MICOTIN ?Apply topically 2 (two) times daily. ?  ? ?  ?     ? ?Past Medical History:  ?Diagnosis Date  ? Goiter   ? ? ?Past Surgical History:  ?Procedure Laterality Date  ? EYE SURGERY Right 2002  ? PELVIC LAPAROSCOPY  2003  ? ectopic pregnancy  ? ? ?Family History  ?Problem Relation Age of Onset  ? Alcohol abuse Father   ? Heart disease Paternal Grandfather   ? Thyroid disease Neg Hx   ? ? ?Social History:  reports that she quit smoking about 3 years ago. Her smoking use included cigarettes. She has never used smokeless tobacco. She reports that she does not currently use alcohol. She reports that  she does not use drugs. ? ?Allergies:  ?Allergies  ?Allergen Reactions  ? Latex Hives  ? ? ? ?Review of Systems  ? ? ?History of allergic rhinitis ? ? ? Examination: ?  ?There were no vitals taken for this visit. ? ?She has a 1.5 cm firm nodule in the isthmus area  ?Right lobe shows a 2-2.5 cm firm right smooth nodule ? ?Left side is about 1-1/2 times enlarged, slightly firm and relatively smooth ? ?Deep tendon reflexes at biceps are normal ?No tremor ? ? ? Assessment/Plan: ? 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 ?She has had persistent hypothyroidism after radioactive iodine ablation ?Subjectively doing well ?Has been maintained on 75 mcg of levothyroxine since 2018 ? ?Previously had hot nodules on the right side and currently also her right thyroid is relatively more enlarged ? ?Thyroid levels are quite normal although TSH is below 1.0 again along with very normal levels of T3 and T4 ? ?She will come back annually for follow-up ? ?Elayne Snare ?07/09/2021, 12:14 PM  ? ? ? ?

## 2024-03-10 ENCOUNTER — Encounter (HOSPITAL_COMMUNITY): Payer: Self-pay

## 2024-03-10 ENCOUNTER — Other Ambulatory Visit: Payer: Self-pay

## 2024-03-10 ENCOUNTER — Emergency Department (HOSPITAL_COMMUNITY)
Admission: EM | Admit: 2024-03-10 | Discharge: 2024-03-10 | Discharge status: Against Medical Advice | Payer: Self-pay | Attending: Emergency Medicine | Admitting: Emergency Medicine

## 2024-03-10 ENCOUNTER — Ambulatory Visit (HOSPITAL_COMMUNITY)
Admission: EM | Admit: 2024-03-10 | Discharge: 2024-03-10 | Disposition: A | Discharge status: Home / Self Care | Payer: Self-pay | Attending: Family Medicine | Admitting: Family Medicine

## 2024-03-10 DIAGNOSIS — L309 Dermatitis, unspecified: Secondary | ICD-10-CM

## 2024-03-10 DIAGNOSIS — W57XXXA Bitten or stung by nonvenomous insect and other nonvenomous arthropods, initial encounter: Secondary | ICD-10-CM | POA: Insufficient documentation

## 2024-03-10 DIAGNOSIS — R21 Rash and other nonspecific skin eruption: Secondary | ICD-10-CM | POA: Insufficient documentation

## 2024-03-10 DIAGNOSIS — Z5321 Procedure and treatment not carried out due to patient leaving prior to being seen by health care provider: Secondary | ICD-10-CM | POA: Insufficient documentation

## 2024-03-10 MED ORDER — TRIAMCINOLONE ACETONIDE 0.1 % EX CREA: 1.0000 | TOPICAL_CREAM | Freq: Two times a day (BID) | CUTANEOUS | 0 refills | Status: AC

## 2024-03-10 NOTE — ED Triage Notes (Signed)
 Pt came in via POV d/t having a tree in the front yard of her apartment that she states is causing bugs to be able to get into her living space. States that she noticed bug bites on her Lt elbow, Lt knee & on her face. A/Ox4, denies pain, just itches.

## 2024-03-10 NOTE — ED Notes (Signed)
 Pt advised this RN she no longer wanted to wait. Pt encouraged to stay for care. Pt witnessed leaving the property.

## 2024-03-10 NOTE — ED Provider Notes (Signed)
 [facility]    CSN: [id number] Arrival date & time: 03/10/24  0931      History   Chief Complaint Chief Complaint  Patient presents with   Insect Bite    HPI Tonya Porter is a 43 y.o. female.   HPI  Here for bug bites and itching and rash.  She has had areas that are itching and rash she and spots on her elbows and legs.  She is convinced that this is due to bug bites.  She states she cannot see the bugs with the neck it I but she shows me a video with a blue light shining on her skin.  There are white spots that she says are flying insects that are causing this itching and rash.  She has tried to scrape these off.  No trouble breathing and no fever  NKDA except for latex  Last menstrual cycle was November 10   Past Medical History:  Diagnosis Date   Goiter     Patient Active Problem List   Diagnosis Date Noted   Iron deficiency anemia due to chronic blood loss 05/21/2018   Tinea pedis of left foot 05/21/2018   Hypothyroidism following radioiodine therapy 03/23/2017   Multinodular goiter 06/28/2016    Past Surgical History:  Procedure Laterality Date   EYE SURGERY Right 2002   PELVIC LAPAROSCOPY  2003   ectopic pregnancy    OB History     Gravida  2   Para  0   Term      Preterm      AB  1   Living         SAB      IAB      Ectopic  1   Multiple      Live Births               Home Medications    Prior to Admission medications   Medication Sig Start Date End Date Taking? Authorizing Provider  triamcinolone cream (KENALOG) 0.1 % Apply 1 Application topically 2 (two) times daily. To affected areas on arms and legs till better; up to 2 weeks. 03/10/24  Yes Vonna Sharlet POUR, MD  levothyroxine  (SYNTHROID ) 75 MCG tablet Take 1 tablet (75 mcg total) by mouth daily. 04/24/19   [place], MD  miconazole  (MICOTIN) 2 % powder Apply topically 2 (two) times daily. Patient not taking: Reported on 07/03/2020 05/17/18   Nche,  Roselie Rockford, NP    Family History Family History  Problem Relation Age of Onset   Alcohol abuse Father    Heart disease Paternal Grandfather    Thyroid  disease Neg Hx     Social History Social History   Tobacco Use   Smoking status: Former    Current packs/day: 0.00    Types: Cigarettes    Start date: 03/26/2016    Quit date: 03/26/2018    Years since quitting: 5.9   Smokeless tobacco: Never  Vaping Use   Vaping status: Never Used  Substance Use Topics   Alcohol use: Not Currently   Drug use: Never     Allergies   Latex   Review of Systems Review of Systems   Physical Exam Triage Vital Signs ED Triage Vitals  Encounter Vitals Group     BP 03/10/24 1035 121/76     Girls Systolic BP Percentile --      Girls Diastolic BP Percentile --      Boys Systolic BP Percentile --  Boys Diastolic BP Percentile --      Pulse Rate 03/10/24 1036 91     Resp 03/10/24 1035 18     Temp 03/10/24 1036 98 F (36.7 C)     Temp Source 03/10/24 1036 Oral     SpO2 03/10/24 1035 98 %     Weight --      Height --      Head Circumference --      Peak Flow --      Pain Score --      Pain Loc --      Pain Education --      Exclude from Growth Chart --    No data found.  Updated Vital Signs BP 121/76 (BP Location: Left Arm)   Pulse 91   Temp 98 F (36.7 C) (Oral)   Resp 18   LMP 03/05/2024 (Approximate)   SpO2 98%   Visual Acuity Right Eye Distance:   Left Eye Distance:   Bilateral Distance:    Right Eye Near:   Left Eye Near:    Bilateral Near:     Physical Exam Vitals reviewed.  Constitutional:      General: She is not in acute distress.    Appearance: She is not toxic-appearing.  HENT:     Mouth/Throat:     Mouth: Mucous membranes are moist.  Eyes:     Extraocular Movements: Extraocular movements intact.     Conjunctiva/sclera: Conjunctivae normal.     Pupils: Pupils are equal, round, and reactive to light.  Cardiovascular:     Rate and Rhythm:  Normal rate and regular rhythm.     Heart sounds: No murmur heard. Pulmonary:     Effort: Pulmonary effort is normal. No respiratory distress.     Breath sounds: Normal breath sounds. No stridor. No wheezing or rhonchi.  Skin:    Coloration: Skin is not jaundiced or pale.     Comments: There is a flat slightly scaly rash on her left olecranon process.  It is about 2-1/2 cm in diameter.  There are similar areas on her left medial lower leg and thigh.  Those areas on her legs are about 1.5 cm in diameter.  There is no sign of secondary infection and no drainage.  Concerning the video she showed me with the bluelight on her skin, I see some little white areas that do not look like insects to me.  Neurological:     General: No focal deficit present.     Mental Status: She is alert and oriented to person, place, and time.  Psychiatric:        Behavior: Behavior normal.      UC Treatments / Results  Labs (all labs ordered are listed, but only abnormal results are displayed) Labs Reviewed - No data to display  EKG   Radiology No results found.  Procedures Procedures (including critical care time)  Medications Ordered in UC Medications - No data to display  Initial Impression / Assessment and Plan / UC Course  I have reviewed the triage vital signs and the nursing notes.  Pertinent labs & imaging results that were available during my care of the patient were reviewed by me and considered in my medical decision making (see chart for details).   I am told the patient cannot tell that those are insects or mites.  This is not consistent with scabies.  She is surprised to hear that I do not have any medication to make  the bugs go away.  Apparently she has tried a civil service fast streamer in her home.  Triamcinolone is sent in to treat the rash  I have asked her to follow-up with primary care.  Final Clinical Impressions(s) / UC Diagnoses   Final diagnoses:  Dermatitis     Discharge  Instructions      Triamcinolone cream--apply 2 times daily to the rash area until better, about 10 to 14 days.  You can use the QR code/website at the back of the summary paperwork to schedule yourself a new patient appointment with primary care  Please follow-up with primary care     ED Prescriptions     Medication Sig Dispense Auth. Provider   triamcinolone cream (KENALOG) 0.1 % Apply 1 Application topically 2 (two) times daily. To affected areas on arms and legs till better; up to 2 weeks. 80 g Vonna Sharlet POUR, MD      PDMP not reviewed this encounter.   Vonna Sharlet POUR, MD 03/10/24 [phone]

## 2024-03-10 NOTE — ED Triage Notes (Signed)
 Patient presents to the office for bug bites all over her body since 09/2023. Patient states she took the blue light fly trap and placed it against her skin and could see the bugs under her skin.

## 2024-03-10 NOTE — ED Triage Notes (Signed)
 First Nurse Note: Pt presents with c/o of bug bites around her elbow and knees. Pt reports seeing barely visible white bugs under a blue light trap.

## 2024-03-10 NOTE — Discharge Instructions (Signed)
 Triamcinolone cream--apply 2 times daily to the rash area until better, about 10 to 14 days.  You can use the QR code/website at the back of the summary paperwork to schedule yourself a new patient appointment with primary care  Please follow-up with primary care

## 2024-03-10 NOTE — ED Provider Triage Note (Signed)
 Emergency Medicine Provider Triage Evaluation Note  Tonya Porter , a 43 y.o. female  was evaluated in triage.  Pt complains of itching rash. She reports that she can see bugs crawling on her. She was seen at [facility] and wants medication to get rid of the bugs. Denies fever. .  Review of Systems  Positive:  Negative:   Physical Exam  BP 124/82   Pulse 85   Temp 98.5 F (36.9 C) (Oral)   Resp 14   Ht 5' 3 (1.6 m)   Wt 61.2 kg   LMP 03/05/2024 (Approximate)   SpO2 100%   BMI 23.91 kg/m  Gen:   Awake, no distress   Resp:  Normal effort  MSK:   Moves extremities without difficulty  Other:    Medical Decision Making  Medically screening exam initiated at 1:03 PM.  Appropriate orders placed.  Tonya Porter was informed that the remainder of the evaluation will be completed by another provider, this initial triage assessment does not replace that evaluation, and the importance of remaining in the ED until their evaluation is complete.     Bernis Ernst, PA-C 03/10/24 1306
